# Patient Record
Sex: Female | Born: 1960
Health system: Southern US, Community
[De-identification: ages and names within clinical notes are randomized; demographics above are authoritative.]

## PROBLEM LIST (undated history)

## (undated) DIAGNOSIS — K219 Gastro-esophageal reflux disease without esophagitis: Secondary | ICD-10-CM

## (undated) DIAGNOSIS — M1990 Unspecified osteoarthritis, unspecified site: Secondary | ICD-10-CM

## (undated) DIAGNOSIS — E669 Obesity, unspecified: Secondary | ICD-10-CM

## (undated) DIAGNOSIS — E119 Type 2 diabetes mellitus without complications: Secondary | ICD-10-CM

## (undated) DIAGNOSIS — G4730 Sleep apnea, unspecified: Secondary | ICD-10-CM

## (undated) DIAGNOSIS — I10 Essential (primary) hypertension: Secondary | ICD-10-CM

## (undated) DIAGNOSIS — E785 Hyperlipidemia, unspecified: Secondary | ICD-10-CM

## (undated) HISTORY — DX: Hyperlipidemia, unspecified: E78.5

## (undated) HISTORY — DX: Essential (primary) hypertension: I10

## (undated) HISTORY — DX: Sleep apnea, unspecified: G47.30

## (undated) HISTORY — DX: Type 2 diabetes mellitus without complications: E11.9

## (undated) HISTORY — PX: UPPER GASTROINTESTINAL ENDOSCOPY: SHX188

## (undated) HISTORY — DX: Obesity, unspecified: E66.9

## (undated) HISTORY — PX: KNEE ARTHROSCOPY: SUR90

## (undated) HISTORY — DX: Gastro-esophageal reflux disease without esophagitis: K21.9

## (undated) HISTORY — DX: Unspecified osteoarthritis, unspecified site: M19.90

---

## 1997-11-19 DIAGNOSIS — E669 Obesity, unspecified: Secondary | ICD-10-CM

## 1997-11-19 HISTORY — DX: Obesity, unspecified: E66.9

## 1998-11-19 DIAGNOSIS — M1990 Unspecified osteoarthritis, unspecified site: Secondary | ICD-10-CM

## 1998-11-19 HISTORY — DX: Unspecified osteoarthritis, unspecified site: M19.90

## 2003-11-20 DIAGNOSIS — E119 Type 2 diabetes mellitus without complications: Secondary | ICD-10-CM

## 2003-11-20 HISTORY — DX: Type 2 diabetes mellitus without complications: E11.9

## 2005-11-19 DIAGNOSIS — I10 Essential (primary) hypertension: Secondary | ICD-10-CM

## 2005-11-19 DIAGNOSIS — G4730 Sleep apnea, unspecified: Secondary | ICD-10-CM

## 2005-11-19 HISTORY — DX: Essential (primary) hypertension: I10

## 2005-11-19 HISTORY — DX: Sleep apnea, unspecified: G47.30

## 2012-01-02 ENCOUNTER — Encounter: Payer: Self-pay | Admitting: Internal Medicine

## 2012-02-26 ENCOUNTER — Ambulatory Visit: Payer: Self-pay | Admitting: Internal Medicine

## 2013-02-19 ENCOUNTER — Encounter: Payer: Self-pay | Admitting: Gastroenterology

## 2013-03-10 ENCOUNTER — Ambulatory Visit: Payer: Self-pay | Admitting: Gastroenterology

## 2013-03-18 ENCOUNTER — Encounter: Payer: Self-pay | Admitting: Gastroenterology

## 2013-03-18 ENCOUNTER — Ambulatory Visit (INDEPENDENT_AMBULATORY_CARE_PROVIDER_SITE_OTHER): Payer: Medicare Other | Admitting: Gastroenterology

## 2013-03-18 ENCOUNTER — Other Ambulatory Visit (INDEPENDENT_AMBULATORY_CARE_PROVIDER_SITE_OTHER): Payer: Medicare Other

## 2013-03-18 VITALS — BP 140/90 | HR 88 | Ht 63.5 in | Wt 254.1 lb

## 2013-03-18 DIAGNOSIS — R112 Nausea with vomiting, unspecified: Secondary | ICD-10-CM

## 2013-03-18 DIAGNOSIS — K3189 Other diseases of stomach and duodenum: Secondary | ICD-10-CM | POA: Insufficient documentation

## 2013-03-18 DIAGNOSIS — R1013 Epigastric pain: Secondary | ICD-10-CM | POA: Insufficient documentation

## 2013-03-18 DIAGNOSIS — R1310 Dysphagia, unspecified: Secondary | ICD-10-CM

## 2013-03-18 LAB — CBC WITH DIFFERENTIAL/PLATELET
Basophils Absolute: 0 10*3/uL (ref 0.0–0.1)
Eosinophils Absolute: 1.3 10*3/uL — ABNORMAL HIGH (ref 0.0–0.7)
Lymphocytes Relative: 18.2 % (ref 12.0–46.0)
MCHC: 32.2 g/dL (ref 30.0–36.0)
Monocytes Absolute: 0.7 10*3/uL (ref 0.1–1.0)
Neutro Abs: 6.5 10*3/uL (ref 1.4–7.7)
Neutrophils Relative %: 62.9 % (ref 43.0–77.0)
RDW: 16.6 % — ABNORMAL HIGH (ref 11.5–14.6)

## 2013-03-18 LAB — COMPREHENSIVE METABOLIC PANEL
ALT: 11 U/L (ref 0–35)
AST: 10 U/L (ref 0–37)
Alkaline Phosphatase: 87 U/L (ref 39–117)
Calcium: 8.9 mg/dL (ref 8.4–10.5)
Chloride: 101 mEq/L (ref 96–112)
Creatinine, Ser: 0.7 mg/dL (ref 0.4–1.2)
Potassium: 3.8 mEq/L (ref 3.5–5.1)

## 2013-03-18 MED ORDER — NA SULFATE-K SULFATE-MG SULF 17.5-3.13-1.6 GM/177ML PO SOLN: 1.0000 | Freq: Once | ORAL | Status: DC

## 2013-03-18 MED ORDER — HYOSCYAMINE SULFATE 0.125 MG SL SUBL: 0.1250 mg | SUBLINGUAL_TABLET | SUBLINGUAL | Status: DC | PRN

## 2013-03-18 NOTE — Progress Notes (Signed)
History of Present Illness: Pleasant 52 year old Afro-American female referred at the request of Dr. Kathrynn Speed evaluation of dyspepsia. She complains of excess foul-smelling eructations and severe foul-smelling gas. She frequently has episodes of diarrhea. She states bloated and complains of nausea. She has occasional dysphagia to solids. He denies melena or rectal bleeding. She takes Nexium regularly. Recently she was started on an antibiotic for UTI but has not noted any change in her GI symptoms. She takes low dose of Reglan.    Past Medical History  Diagnosis Date  . Arthritis 2000  . DM (diabetes mellitus) 2005  . HTN (hypertension) 2007  . Obesity 1999  . Sleep apnea 2007  . GERD (gastroesophageal reflux disease)   . HLD (hyperlipidemia)    Past Surgical History  Procedure Laterality Date  . Knee arthroscopy Right   . Cesarean section     family history includes Diabetes in her maternal grandmother and mother and Esophageal cancer in her paternal grandmother. Current Outpatient Prescriptions  Medication Sig Dispense Refill  . amitriptyline (ELAVIL) 10 MG tablet Take 40-50 mg by mouth at bedtime.      Marland Kitchen esomeprazole (NEXIUM) 40 MG capsule Take 40 mg by mouth daily before breakfast.      . hydrochlorothiazide (MICROZIDE) 12.5 MG capsule Take 12.5 mg by mouth daily.      . insulin detemir (LEVEMIR) 100 UNIT/ML injection Inject 30 Units into the skin at bedtime.      . insulin lispro (HUMALOG) 100 UNIT/ML injection Inject 50 Units into the skin 2 (two) times daily.      . meloxicam (MOBIC) 15 MG tablet Take 15 mg by mouth daily.      . metoCLOPramide (REGLAN) 10 MG tablet Take 10 mg by mouth 3 (three) times daily.      Marland Kitchen oxyCODONE-acetaminophen (PERCOCET) 10-325 MG per tablet Take 1 tablet by mouth 4 (four) times daily as needed for pain.      . ramipril (ALTACE) 10 MG capsule Take 10 mg by mouth daily.      . simvastatin (ZOCOR) 40 MG tablet Take 40 mg by mouth daily.      .  sitaGLIPtan-metformin (JANUMET) 50-1000 MG per tablet Take 1 tablet by mouth 2 (two) times daily with a meal.      . tiZANidine (ZANAFLEX) 4 MG tablet Take 4 mg by mouth 3 (three) times daily as needed.       No current facility-administered medications for this visit.   Allergies as of 03/18/2013  . (No Known Allergies)    reports that she has never smoked. She has never used smokeless tobacco. She reports that she does not drink alcohol or use illicit drugs.     Review of Systems: Pertinent positive and negative review of systems were noted in the above HPI section. All other review of systems were otherwise negative.  Vital signs were reviewed in today's medical record Physical Exam: General: Well developed , well nourished, no acute distress Skin: anicteric Head: Normocephalic and atraumatic Eyes:  sclerae anicteric, EOMI Ears: Normal auditory acuity Mouth: No deformity or lesions Neck: Supple, no masses or thyromegaly Lungs: Clear throughout to auscultation Heart: Regular rate and rhythm; no murmurs, rubs or bruits Abdomen: Soft, non tender and non distended. No masses, hepatosplenomegaly or hernias noted. Normal Bowel sounds.  There is no succussion splash Rectal:deferred Musculoskeletal: Symmetrical with no gross deformities  Skin: No lesions on visible extremities Pulses:  Normal pulses noted Extremities: No clubbing, cyanosis, edema or deformities noted  Neurological: Alert oriented x 4, grossly nonfocal Cervical Nodes:  No significant cervical adenopathy Inguinal Nodes: No significant inguinal adenopathy Psychological:  Alert and cooperative. Normal mood and affect

## 2013-03-18 NOTE — Patient Instructions (Addendum)
You have been scheduled for a gastric emptying scan at [facility] on 04/06/2013 at 9am. Please arrive at least 15 minutes prior to your appointment for registration. Please make certain not to have anything to eat or drink after midnight the night before your test. Hold all stomach medications (ex: Zofran, phenergan, Reglan) 48 hours prior to your test. If you need to reschedule your appointment, please contact radiology scheduling at [phone]. _____________________________________________________________________ A gastric-emptying study measures how long it takes for food to move through your stomach. There are several ways to measure stomach emptying. In the most common test, you eat food that contains a small amount of radioactive material. A scanner that detects the movement of the radioactive material is placed over your abdomen to monitor the rate at which food leaves your stomach. This test normally takes about 2 hours to complete. _____________________________________________________________________  Samantha Burgess have been scheduled for an endoscopy with propofol. Please follow written instructions given to you at your visit today. If you use inhalers (even only as needed), please bring them with you on the day of your procedure. Your physician has requested that you go to www.startemmi.com and enter the access code given to you at your visit today. This web site gives a general overview about your procedure. However, you should still follow specific instructions given to you by our office regarding your preparation for the procedure.  You have been scheduled for a colonoscopy with propofol. Please follow written instructions given to you at your visit today.  Please pick up your prep kit at the pharmacy within the next 1-3 days. If you use inhalers (even only as needed), please bring them with you on the day of your procedure. Your physician has requested that you go to www.startemmi.com and enter the access  code given to you at your visit today. This web site gives a general overview about your procedure. However, you should still follow specific instructions given to you by our office regarding your preparation for the procedure.

## 2013-03-18 NOTE — Assessment & Plan Note (Signed)
Rule out early peptic stricture  Recommendations #1 upper endoscopy with dilatation as indicated  Risks, alternatives, and complications of the procedure, including bleeding, perforation, and possible need for surgery, were explained to the patient.  Patient's questions were answered.

## 2013-03-18 NOTE — Assessment & Plan Note (Addendum)
Patient's symptoms could be explained by gastroparesis and perhaps a motility disorder with bacterial overgrowth.  Active peptic disease is unlikely in the face of PPI therapy. A structural abnormality is also less likely.  Recommendations #1 upper endoscopy and colonoscopy #2 gastric emptying scan while continuing Reglan #3 check blood work including CBC and comprehensive metabolic profile

## 2013-03-24 ENCOUNTER — Encounter: Payer: Self-pay | Admitting: Gastroenterology

## 2013-03-24 ENCOUNTER — Other Ambulatory Visit: Payer: Self-pay | Admitting: Gastroenterology

## 2013-03-24 ENCOUNTER — Ambulatory Visit (AMBULATORY_SURGERY_CENTER): Payer: Medicare Other | Admitting: Gastroenterology

## 2013-03-24 VITALS — BP 149/94 | HR 86 | Temp 98.4°F | Resp 20 | Ht 63.0 in | Wt 254.0 lb

## 2013-03-24 DIAGNOSIS — K222 Esophageal obstruction: Secondary | ICD-10-CM

## 2013-03-24 DIAGNOSIS — R112 Nausea with vomiting, unspecified: Secondary | ICD-10-CM

## 2013-03-24 DIAGNOSIS — R1310 Dysphagia, unspecified: Secondary | ICD-10-CM

## 2013-03-24 DIAGNOSIS — K3189 Other diseases of stomach and duodenum: Secondary | ICD-10-CM

## 2013-03-24 MED ORDER — SODIUM CHLORIDE 0.9 % IV SOLN: 500.0000 mL | INTRAVENOUS | Status: DC

## 2013-03-24 NOTE — Progress Notes (Signed)
Patient did not experience any of the following events: a burn prior to discharge; a fall within the facility; wrong site/side/patient/procedure/implant event; or a hospital transfer or hospital admission upon discharge from the facility. (G8907) Patient did not have preoperative order for IV antibiotic SSI prophylaxis. (G8918)  

## 2013-03-24 NOTE — Op Note (Signed)
[facility] [address] [place], [address]   ENDOSCOPY PROCEDURE REPORT  PATIENT: Samantha Burgess, Samantha Burgess  MR#: [id number] BIRTHDATE: January 30, 1961 , 52  yrs. old GENDER: Female ENDOSCOPIST: Louis Meckel, MD REFERRED BY: PROCEDURE DATE:  03/24/2013 PROCEDURE:  EGD, diagnostic and Maloney dilation of esophagus ASA CLASS:     Class II INDICATIONS:  Dysphagia.   Dyspepsia. MEDICATIONS: MAC sedation, administered by CRNA, , Simethicone 0.6cc PO, and propofol (Diprivan) 250mg  IV TOPICAL ANESTHETIC: Cetacaine Spray  DESCRIPTION OF PROCEDURE: After the risks benefits and alternatives of the procedure were thoroughly explained, informed consent was obtained.  The LB [id number] endoscope was introduced through the mouth and advanced to the third portion of the duodenum. Without limitations.  The instrument was slowly withdrawn as the mucosa was fully examined.        ESOPHAGUS: A stricture was found at the gastroesophageal junction. The 7 mm gastroscope easily traversed the stricture.  The exam was otherwise normal.  Retroflexed views revealed no abnormalities. The scope was then withdrawn from the patient.  A #52 Jerene Dilling dilator was passed with moderate resistance. There was no heme.  COMPLICATIONS: There were no complications.  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

## 2013-03-24 NOTE — Progress Notes (Signed)
Informed [facility] CRNA of elevated BP. Cleared for procedure by Ms. Camp.

## 2013-03-24 NOTE — Patient Instructions (Addendum)
YOU HAD AN ENDOSCOPIC PROCEDURE TODAY AT THE [facility]: Refer to the procedure report that was given to you for any specific questions about what was found during the examination.  If the procedure report does not answer your questions, please call your gastroenterologist to clarify.  If you requested that your care partner not be given the details of your procedure findings, then the procedure report has been included in a sealed envelope for you to review at your convenience later.  YOU SHOULD EXPECT: Some feelings of bloating in the abdomen. Passage of more gas than usual.  Walking can help get rid of the air that was put into your GI tract during the procedure and reduce the bloating. If you had a lower endoscopy (such as a colonoscopy or flexible sigmoidoscopy) you may notice spotting of blood in your stool or on the toilet paper. If you underwent a bowel prep for your procedure, then you may not have a normal bowel movement for a few days.  DIET: See dilation diet- Drink plenty of fluids but you should avoid alcoholic beverages for 24 hours.  ACTIVITY: Your care partner should take you home directly after the procedure.  You should plan to take it easy, moving slowly for the rest of the day.  You can resume normal activity the day after the procedure however you should NOT DRIVE or use heavy machinery for 24 hours (because of the sedation medicines used during the test).    SYMPTOMS TO REPORT IMMEDIATELY: A gastroenterologist can be reached at any hour.  During normal business hours, 8:30 AM to 5:00 PM Monday through Friday, call [phone].  After hours and on weekends, please call the GI answering service at [phone] who will take a message and have the physician on call contact you.   Following upper endoscopy (EGD)  Vomiting of blood or coffee ground material  New chest pain or pain under the shoulder blades  Painful or persistently difficult swallowing  New  shortness of breath  Fever of 100F or higher  Black, tarry-looking stools  FOLLOW UP: If any biopsies were taken you will be contacted by phone or by letter within the next 1-3 weeks.  Call your gastroenterologist if you have not heard about the biopsies in 3 weeks.  Our staff will call the home number listed on your records the next business day following your procedure to check on you and address any questions or concerns that you may have at that time regarding the information given to you following your procedure. This is a courtesy call and so if there is no answer at the home number and we have not heard from you through the emergency physician on call, we will assume that you have returned to your regular daily activities without incident.  SIGNATURES/CONFIDENTIALITY: You and/or your care partner have signed paperwork which will be entered into your electronic medical record.  These signatures attest to the fact that that the information above on your After Visit Summary has been reviewed and is understood.  Full responsibility of the confidentiality of this discharge information lies with you and/or your care-partner.  Dilation Diet, stricture-handouts given  Repeat dilation as needed

## 2013-03-25 ENCOUNTER — Telehealth: Payer: Self-pay | Admitting: *Deleted

## 2013-03-25 ENCOUNTER — Other Ambulatory Visit: Payer: Self-pay | Admitting: Gastroenterology

## 2013-03-25 DIAGNOSIS — D509 Iron deficiency anemia, unspecified: Secondary | ICD-10-CM

## 2013-03-25 NOTE — Telephone Encounter (Signed)
Name identifier, left message, follow-up 

## 2013-03-26 ENCOUNTER — Other Ambulatory Visit (INDEPENDENT_AMBULATORY_CARE_PROVIDER_SITE_OTHER): Payer: Medicare Other

## 2013-03-26 ENCOUNTER — Other Ambulatory Visit: Payer: Self-pay | Admitting: *Deleted

## 2013-03-26 DIAGNOSIS — D649 Anemia, unspecified: Secondary | ICD-10-CM

## 2013-03-26 DIAGNOSIS — D509 Iron deficiency anemia, unspecified: Secondary | ICD-10-CM

## 2013-03-26 LAB — IBC PANEL
Saturation Ratios: 3.1 % — ABNORMAL LOW (ref 20.0–50.0)
Transferrin: 325.7 mg/dL (ref 212.0–360.0)

## 2013-03-26 LAB — FERRITIN: Ferritin: 4.3 ng/mL — ABNORMAL LOW (ref 10.0–291.0)

## 2013-03-27 LAB — RETICULIN ANTIBODIES, IGA W TITER

## 2013-03-30 LAB — GLIADIN ANTIBODIES, SERUM
Gliadin IgA: 2.5 U/mL (ref <20)
Gliadin IgG: 3.8 U/mL (ref <20)

## 2013-04-06 ENCOUNTER — Other Ambulatory Visit: Payer: Medicare Other

## 2013-04-06 ENCOUNTER — Encounter (HOSPITAL_COMMUNITY)
Admission: RE | Admit: 2013-04-06 | Discharge: 2013-04-06 | Disposition: A | Discharge status: Home / Self Care | Payer: Medicare Other | Source: Ambulatory Visit | Attending: Gastroenterology | Admitting: Gastroenterology

## 2013-04-06 DIAGNOSIS — R142 Eructation: Secondary | ICD-10-CM | POA: Insufficient documentation

## 2013-04-06 DIAGNOSIS — D509 Iron deficiency anemia, unspecified: Secondary | ICD-10-CM

## 2013-04-06 DIAGNOSIS — R112 Nausea with vomiting, unspecified: Secondary | ICD-10-CM

## 2013-04-06 DIAGNOSIS — R1013 Epigastric pain: Secondary | ICD-10-CM | POA: Insufficient documentation

## 2013-04-06 DIAGNOSIS — K219 Gastro-esophageal reflux disease without esophagitis: Secondary | ICD-10-CM | POA: Insufficient documentation

## 2013-04-06 DIAGNOSIS — R141 Gas pain: Secondary | ICD-10-CM | POA: Insufficient documentation

## 2013-04-06 DIAGNOSIS — K3189 Other diseases of stomach and duodenum: Secondary | ICD-10-CM | POA: Insufficient documentation

## 2013-04-06 MED ORDER — TECHNETIUM TC 99M SULFUR COLLOID
2.2000 | Freq: Once | INTRAVENOUS | Status: AC | PRN
  Administered 2013-04-06: 2.2 via INTRAVENOUS

## 2013-04-08 LAB — OVA AND PARASITE SCREEN: OP: NONE SEEN

## 2013-04-28 ENCOUNTER — Ambulatory Visit (AMBULATORY_SURGERY_CENTER): Payer: Medicare Other | Admitting: Gastroenterology

## 2013-04-28 ENCOUNTER — Encounter: Payer: Self-pay | Admitting: Gastroenterology

## 2013-04-28 VITALS — BP 124/83 | HR 93 | Temp 98.4°F | Resp 17 | Ht 63.0 in | Wt 254.0 lb

## 2013-04-28 DIAGNOSIS — D126 Benign neoplasm of colon, unspecified: Secondary | ICD-10-CM

## 2013-04-28 DIAGNOSIS — R197 Diarrhea, unspecified: Secondary | ICD-10-CM

## 2013-04-28 DIAGNOSIS — R112 Nausea with vomiting, unspecified: Secondary | ICD-10-CM

## 2013-04-28 MED ORDER — AMPICILLIN 250 MG PO CAPS: 250.0000 mg | ORAL_CAPSULE | Freq: Four times a day (QID) | ORAL | Status: DC

## 2013-04-28 MED ORDER — SODIUM CHLORIDE 0.9 % IV SOLN: 500.0000 mL | INTRAVENOUS | Status: DC

## 2013-04-28 NOTE — Progress Notes (Signed)
Patient did not experience any of the following events: a burn prior to discharge; a fall within the facility; wrong site/side/patient/procedure/implant event; or a hospital transfer or hospital admission upon discharge from the facility. (G8907) Patient did not have preoperative order for IV antibiotic SSI prophylaxis. (G8918)  

## 2013-04-28 NOTE — Op Note (Signed)
[facility] [address] [place], [address]   COLONOSCOPY PROCEDURE REPORT  PATIENT: Samantha Burgess, Samantha Burgess  MR#: [id number] BIRTHDATE: [birth year] , 52  yrs. old GENDER: Female ENDOSCOPIST: Louis Meckel, MD REFERRED XB:JYNW M.  Kathrynn Speed, M.D. PROCEDURE DATE:  04/28/2013 PROCEDURE:   Colonoscopy with snare polypectomy ASA CLASS:   Class II INDICATIONS: MEDICATIONS: MAC sedation, administered by CRNA and propofol (Diprivan) 500mg  IV  DESCRIPTION OF PROCEDURE:   After the risks benefits and alternatives of the procedure were thoroughly explained, informed consent was obtained.  A digital rectal exam revealed no abnormalities of the rectum.   The LB [id number]  endoscope was introduced through the anus and advanced to the cecum, which was identified by both the appendix and ileocecal valve. No adverse events experienced.   The quality of the prep was Suprep good  The instrument was then slowly withdrawn as the colon was fully examined.      COLON FINDINGS: A sessile polyp measuring 3-4 mm in size was found in the transverse colon.  A polypectomy was performed with a cold snare.  The resection was complete and the polyp tissue was completely retrieved.   A sessile polyp measuring 2 mm in size was found in the transverse colon.  A polypectomy was performed with a cold snare.  The resection was complete and the polyp tissue was completely retrieved.   The colon mucosa was otherwise normal. Retroflexed views revealed no abnormalities. The time to cecum=5 minutes 06 seconds.  Withdrawal time=16 minutes 39 seconds.  The scope was withdrawn and the procedure completed. COMPLICATIONS: There were no complications.  ENDOSCOPIC IMPRESSION: 1.   Sessile polyp measuring 3-4 mm in size was found in the transverse colon; polypectomy was performed with a cold snare 2.   Sessile polyp measuring 2 mm in size was found in the transverse colon; polypectomy was performed  with a cold snare 3.   The colon mucosa was otherwise normal  RECOMMENDATIONS: 1.  If the polyp(s) removed today are proven to be adenomatous (pre-cancerous) polyps, you will need a repeat colonoscopy in 5 years.  Otherwise you should continue to follow colorectal cancer screening guidelines for "routine risk" patients with colonoscopy in 10 years.  You will receive a letter within 1-2 weeks with the results of your biopsy as well as final recommendations.  Please call my office if you have not received a letter after 3 weeks. 2.   trial of ampicillin 250 mg 4 times a day for possible bacterial overgrowth (abnormal gastric emptying scan)   eSigned:  Louis Meckel, MD 04/28/2013 2:22 PM   cc:

## 2013-04-28 NOTE — Progress Notes (Signed)
Called to room to assist during endoscopic procedure.  Patient ID and intended procedure confirmed with present staff. Received instructions for my participation in the procedure from the performing physician.  

## 2013-04-28 NOTE — Patient Instructions (Addendum)

## 2013-04-29 ENCOUNTER — Telehealth: Payer: Self-pay | Admitting: *Deleted

## 2013-04-29 NOTE — Telephone Encounter (Signed)
  Follow up Call-  Call back number 04/28/2013 03/24/2013  Post procedure Call Back phone  # [phone]  Permission to leave phone message Yes Yes     Patient questions:  Do you have a fever, pain , or abdominal swelling? no Pain Score  0 *  Have you tolerated food without any problems? yes  Have you been able to return to your normal activities? yes  Do you have any questions about your discharge instructions: Diet   no Medications  no Follow up visit  no  Do you have questions or concerns about your Care? no  Actions: * If pain score is 4 or above: No action needed, pain <4.

## 2013-05-06 ENCOUNTER — Encounter: Payer: Self-pay | Admitting: Gastroenterology

## 2013-06-16 ENCOUNTER — Other Ambulatory Visit: Payer: Self-pay | Admitting: Gastroenterology

## 2013-06-17 ENCOUNTER — Telehealth: Payer: Self-pay | Admitting: Gastroenterology

## 2013-06-17 ENCOUNTER — Ambulatory Visit: Payer: Medicare Other | Admitting: Gastroenterology

## 2013-06-17 MED ORDER — HYOSCYAMINE SULFATE 0.125 MG SL SUBL: SUBLINGUAL_TABLET | SUBLINGUAL | Status: DC

## 2013-06-17 NOTE — Telephone Encounter (Signed)
Med sent no charge

## 2013-07-27 ENCOUNTER — Ambulatory Visit: Payer: Medicare Other | Admitting: Gastroenterology

## 2014-09-07 ENCOUNTER — Other Ambulatory Visit: Payer: Self-pay | Admitting: Gastroenterology

## 2016-01-16 ENCOUNTER — Encounter (HOSPITAL_BASED_OUTPATIENT_CLINIC_OR_DEPARTMENT_OTHER): Payer: Self-pay | Admitting: *Deleted

## 2016-01-16 ENCOUNTER — Emergency Department (HOSPITAL_BASED_OUTPATIENT_CLINIC_OR_DEPARTMENT_OTHER): Payer: No Typology Code available for payment source

## 2016-01-16 ENCOUNTER — Emergency Department (HOSPITAL_BASED_OUTPATIENT_CLINIC_OR_DEPARTMENT_OTHER)
Admission: EM | Admit: 2016-01-16 | Discharge: 2016-01-16 | Disposition: A | Discharge status: Home / Self Care | Payer: No Typology Code available for payment source | Attending: Emergency Medicine | Admitting: Emergency Medicine

## 2016-01-16 DIAGNOSIS — S29019A Strain of muscle and tendon of unspecified wall of thorax, initial encounter: Secondary | ICD-10-CM

## 2016-01-16 DIAGNOSIS — S29012A Strain of muscle and tendon of back wall of thorax, initial encounter: Secondary | ICD-10-CM | POA: Diagnosis not present

## 2016-01-16 DIAGNOSIS — Z8669 Personal history of other diseases of the nervous system and sense organs: Secondary | ICD-10-CM | POA: Insufficient documentation

## 2016-01-16 DIAGNOSIS — Z8719 Personal history of other diseases of the digestive system: Secondary | ICD-10-CM | POA: Insufficient documentation

## 2016-01-16 DIAGNOSIS — S299XXA Unspecified injury of thorax, initial encounter: Secondary | ICD-10-CM | POA: Diagnosis present

## 2016-01-16 DIAGNOSIS — E785 Hyperlipidemia, unspecified: Secondary | ICD-10-CM | POA: Insufficient documentation

## 2016-01-16 DIAGNOSIS — Z794 Long term (current) use of insulin: Secondary | ICD-10-CM | POA: Diagnosis not present

## 2016-01-16 DIAGNOSIS — E119 Type 2 diabetes mellitus without complications: Secondary | ICD-10-CM | POA: Diagnosis not present

## 2016-01-16 DIAGNOSIS — Z7984 Long term (current) use of oral hypoglycemic drugs: Secondary | ICD-10-CM | POA: Diagnosis not present

## 2016-01-16 DIAGNOSIS — E669 Obesity, unspecified: Secondary | ICD-10-CM | POA: Insufficient documentation

## 2016-01-16 DIAGNOSIS — M1990 Unspecified osteoarthritis, unspecified site: Secondary | ICD-10-CM | POA: Insufficient documentation

## 2016-01-16 DIAGNOSIS — Y92410 Unspecified street and highway as the place of occurrence of the external cause: Secondary | ICD-10-CM | POA: Insufficient documentation

## 2016-01-16 DIAGNOSIS — Y9389 Activity, other specified: Secondary | ICD-10-CM | POA: Diagnosis not present

## 2016-01-16 DIAGNOSIS — Y998 Other external cause status: Secondary | ICD-10-CM | POA: Insufficient documentation

## 2016-01-16 DIAGNOSIS — Z79899 Other long term (current) drug therapy: Secondary | ICD-10-CM | POA: Insufficient documentation

## 2016-01-16 NOTE — ED Notes (Signed)
Pt given d/c instructions. Verbalizes understanding. No questions. 
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
## 2016-05-16 DIAGNOSIS — E1122 Type 2 diabetes mellitus with diabetic chronic kidney disease: Secondary | ICD-10-CM

## 2017-02-04 ENCOUNTER — Encounter (HOSPITAL_BASED_OUTPATIENT_CLINIC_OR_DEPARTMENT_OTHER): Payer: Self-pay | Admitting: Emergency Medicine

## 2017-02-04 ENCOUNTER — Emergency Department (HOSPITAL_BASED_OUTPATIENT_CLINIC_OR_DEPARTMENT_OTHER)
Admission: EM | Admit: 2017-02-04 | Discharge: 2017-02-04 | Disposition: A | Discharge status: Home / Self Care | Payer: Medicare HMO | Attending: Emergency Medicine | Admitting: Emergency Medicine

## 2017-02-04 DIAGNOSIS — I10 Essential (primary) hypertension: Secondary | ICD-10-CM | POA: Diagnosis not present

## 2017-02-04 DIAGNOSIS — H66004 Acute suppurative otitis media without spontaneous rupture of ear drum, recurrent, right ear: Secondary | ICD-10-CM | POA: Insufficient documentation

## 2017-02-04 DIAGNOSIS — E119 Type 2 diabetes mellitus without complications: Secondary | ICD-10-CM | POA: Diagnosis not present

## 2017-02-04 DIAGNOSIS — H66001 Acute suppurative otitis media without spontaneous rupture of ear drum, right ear: Secondary | ICD-10-CM

## 2017-02-04 DIAGNOSIS — Z79899 Other long term (current) drug therapy: Secondary | ICD-10-CM | POA: Insufficient documentation

## 2017-02-04 DIAGNOSIS — H9201 Otalgia, right ear: Secondary | ICD-10-CM | POA: Diagnosis present

## 2017-02-04 DIAGNOSIS — H60311 Diffuse otitis externa, right ear: Secondary | ICD-10-CM

## 2017-02-04 DIAGNOSIS — Z794 Long term (current) use of insulin: Secondary | ICD-10-CM | POA: Insufficient documentation

## 2017-02-04 DIAGNOSIS — H60501 Unspecified acute noninfective otitis externa, right ear: Secondary | ICD-10-CM | POA: Insufficient documentation

## 2017-02-04 MED ORDER — AMOXICILLIN-POT CLAVULANATE 875-125 MG PO TABS: 1.0000 | ORAL_TABLET | Freq: Two times a day (BID) | ORAL | 0 refills | Status: DC

## 2017-02-04 MED ORDER — IBUPROFEN 800 MG PO TABS
800.0000 mg | ORAL_TABLET | Freq: Once | ORAL | Status: AC
  Administered 2017-02-04: 800 mg via ORAL
  Filled 2017-02-04: qty 1

## 2017-02-04 MED ORDER — AMOXICILLIN-POT CLAVULANATE 875-125 MG PO TABS
1.0000 | ORAL_TABLET | Freq: Once | ORAL | Status: AC
  Administered 2017-02-04: 1 via ORAL
  Filled 2017-02-04: qty 1

## 2017-02-04 MED ORDER — HYDROCODONE-ACETAMINOPHEN 5-325 MG PO TABS
2.0000 | ORAL_TABLET | Freq: Once | ORAL | Status: AC
  Administered 2017-02-04: 2 via ORAL
  Filled 2017-02-04: qty 2

## 2017-02-04 MED ORDER — HYDROCODONE-ACETAMINOPHEN 5-325 MG PO TABS: 1.0000 | ORAL_TABLET | ORAL | 0 refills | Status: DC | PRN

## 2017-02-04 MED ORDER — CIPROFLOXACIN-HYDROCORTISONE 0.2-1 % OT SUSP: 3.0000 [drp] | Freq: Two times a day (BID) | OTIC | 0 refills | Status: AC

## 2017-02-04 MED FILL — AMOX-CLAV 875-125 MG TABLET: 875-125 | 7 days supply | Qty: 14 | Fill #0

## 2017-02-04 NOTE — ED Provider Notes (Signed)
[facility] Provider Note   CSN: [id number] Arrival date & time: 02/04/17  0813     History   Chief Complaint Chief Complaint  Patient presents with  . Otalgia    HPI Samantha Burgess is a 56 y.o. female.  HPI Patient started having ear pain that was mild about 3 days ago. Yesterday however started to become severe. By last night the patient was in severe pain and unable to sleep. She reports that sharp and aching in her ear and on the right side of her face. She has had no associated symptoms. She denies fever, chills, sinus congestion, nasal discharge or sore throat. Reports she has not been having problems her teeth. Does have a couple of teeth that are broken off and decayed in the back but she reports that they have not been causing her any pain and they've been that way a long time. She is diabetic. She had been trying an over-the-counter eardrop started yesterday without any relief. Patient is diabetic. Past Medical History:  Diagnosis Date  . Arthritis 2000  . DM (diabetes mellitus) ([place]) 2005  . GERD (gastroesophageal reflux disease)   . HLD (hyperlipidemia)   . HTN (hypertension) 2007  . Obesity 1999  . Sleep apnea 2007    Patient Active Problem List   Diagnosis Date Noted  . Dyspepsia and other specified disorders of function of stomach 03/18/2013  . Dysphagia, unspecified(787.20) 03/18/2013    Past Surgical History:  Procedure Laterality Date  . CESAREAN SECTION    . KNEE ARTHROSCOPY Right   . UPPER GASTROINTESTINAL ENDOSCOPY      OB History    No data available       Home Medications    Prior to Admission medications   Medication Sig Start Date End Date Taking? Authorizing Provider  hydrochlorothiazide (HYDRODIURIL) 25 MG tablet Take 25 mg by mouth daily.   Yes Historical Provider, MD  insulin aspart protamine- aspart (NOVOLOG MIX 70/30) (70-30) 100 UNIT/ML injection Inject into the skin.   Yes Historical Provider, MD  ramipril  (ALTACE) 10 MG capsule Take 10 mg by mouth daily.   Yes Historical Provider, MD  amitriptyline (ELAVIL) 10 MG tablet Take 40-50 mg by mouth at bedtime.    Historical Provider, MD  amoxicillin-clavulanate (AUGMENTIN) 875-125 MG tablet Take 1 tablet by mouth 2 (two) times daily. One po bid x 7 days 02/04/17   Charlesetta Shanks, MD  ciprofloxacin-hydrocortisone (CIPRO [facility]) otic suspension Place 3 drops into the right ear 2 (two) times daily. 02/04/17 02/11/17  Charlesetta Shanks, MD  HYDROcodone-acetaminophen (NORCO/VICODIN) 5-325 MG tablet Take 1-2 tablets by mouth every 4 (four) hours as needed for moderate pain or severe pain. 02/04/17   Charlesetta Shanks, MD  insulin detemir (LEVEMIR) 100 UNIT/ML injection Inject 30 Units into the skin at bedtime.    Historical Provider, MD  insulin lispro (HUMALOG) 100 UNIT/ML injection Inject 50 Units into the skin 2 (two) times daily.    Historical Provider, MD  oxyCODONE-acetaminophen (PERCOCET) 10-325 MG per tablet Take 1 tablet by mouth 4 (four) times daily as needed for pain.    Historical Provider, MD  simvastatin (ZOCOR) 40 MG tablet Take 40 mg by mouth daily.    Historical Provider, MD  sitaGLIPtan-metformin (JANUMET) 50-1000 MG per tablet Take 1 tablet by mouth 2 (two) times daily with a meal.    Historical Provider, MD    Family History Family History  Problem Relation Age of Onset  . Esophageal cancer Paternal  Grandmother   . Diabetes Mother   . Diabetes Maternal Grandmother   . Colon cancer Neg Hx   . Rectal cancer Neg Hx   . Stomach cancer Neg Hx     Social History Social History  Substance Use Topics  . Smoking status: Never Smoker  . Smokeless tobacco: Never Used  . Alcohol use No     Allergies   Patient has no known allergies.   Review of Systems Review of Systems 10 Systems reviewed and are negative for acute change except as noted in the HPI.   Physical Exam Updated Vital Signs BP (!) 179/108 (BP Location: Left Arm)   Pulse 100    Temp 98.1 F (36.7 C) (Oral)   Resp 18   Ht 5\' 3"  (1.6 m)   Wt 270 lb (122.5 kg)   LMP 03/30/2013   SpO2 100%   BMI 47.83 kg/m   Physical Exam  Constitutional: She is oriented to person, place, and time.  Patient is alert and nontoxic. He is tearful and appears to be in significant pain. Morbid obesity no respiratory distress.  HENT:  Head: Normocephalic and atraumatic.  Right external pinna normal in appearance. No lesions erythema or swelling of the pinna. Patient however does endorse pain and tenderness with movement of the tragus. Ear canal is slightly boggy without significant erythema or discharge. The TM is easily visible. TM is irregular with complete loss of normal landmarks.  Oral examination shows a posterior airway widely patent. No erythema or exudate on the mucous membranes. Patient does have posterior upper molars that are decayed to the gumline and broken off. Is no erythema or exudate or bogginess around these. Range of motion of the jaw without trismus. No facial swelling.  Eyes: EOM are normal.  Neck: Neck supple.  Cardiovascular: Normal rate and regular rhythm.   Pulmonary/Chest: Effort normal and breath sounds normal.  Musculoskeletal: Normal range of motion.  Neurological: She is alert and oriented to person, place, and time. No cranial nerve deficit. Coordination normal.  Skin: Skin is warm and dry.  Psychiatric: She has a normal mood and affect.     ED Treatments / Results  Labs (all labs ordered are listed, but only abnormal results are displayed) Labs Reviewed - No data to display  EKG  EKG Interpretation None       Radiology No results found.  Procedures Procedures (including critical care time)  Medications Ordered in ED Medications  ibuprofen (ADVIL,MOTRIN) tablet 800 mg (not administered)  HYDROcodone-acetaminophen (NORCO/VICODIN) 5-325 MG per tablet 2 tablet (not administered)  amoxicillin-clavulanate (AUGMENTIN) 875-125 MG per tablet 1  tablet (not administered)     Initial Impression / Assessment and Plan / ED Course  I have reviewed the triage vital signs and the nursing notes.  Pertinent labs & imaging results that were available during my care of the patient were reviewed by me and considered in my medical decision making (see chart for details).      Final Clinical Impressions(s) / ED Diagnoses   Final diagnoses:  Acute suppurative otitis media of right ear without spontaneous rupture of tympanic membrane, recurrence not specified  Acute diffuse otitis externa of right ear  Patient has a combination of findings. Patient is diabetic and ear canal is slightly boggy although not significantly swollen. With combination of tragus tenderness, diabetic and bogginess I will opt to initiate her for otitis externa with ciprofloxacin otic solution. TM also is consistent with otitis media will also initiate Augmentin.  The patient does have significant pain, she will be treated with Vicodin for pain control.  New Prescriptions New Prescriptions   AMOXICILLIN-CLAVULANATE (AUGMENTIN) 875-125 MG TABLET    Take 1 tablet by mouth 2 (two) times daily. One po bid x 7 days   CIPROFLOXACIN-HYDROCORTISONE (CIPRO [facility]) OTIC SUSPENSION    Place 3 drops into the right ear 2 (two) times daily.   HYDROCODONE-ACETAMINOPHEN (NORCO/VICODIN) 5-325 MG TABLET    Take 1-2 tablets by mouth every 4 (four) hours as needed for moderate pain or severe pain.     Charlesetta Shanks, MD 02/04/17 [phone]

## 2017-02-04 NOTE — ED Triage Notes (Signed)
Patient reports that she is having pain to her right ear. Has had the pain x 3 days

## 2017-02-06 ENCOUNTER — Telehealth (HOSPITAL_BASED_OUTPATIENT_CLINIC_OR_DEPARTMENT_OTHER): Payer: Self-pay | Admitting: Emergency Medicine

## 2017-05-09 ENCOUNTER — Encounter (HOSPITAL_BASED_OUTPATIENT_CLINIC_OR_DEPARTMENT_OTHER): Payer: Self-pay | Admitting: *Deleted

## 2017-05-09 ENCOUNTER — Emergency Department (HOSPITAL_BASED_OUTPATIENT_CLINIC_OR_DEPARTMENT_OTHER)
Admission: EM | Admit: 2017-05-09 | Discharge: 2017-05-10 | Disposition: A | Discharge status: Home / Self Care | Payer: Medicare HMO | Attending: Emergency Medicine | Admitting: Emergency Medicine

## 2017-05-09 DIAGNOSIS — E119 Type 2 diabetes mellitus without complications: Secondary | ICD-10-CM | POA: Diagnosis not present

## 2017-05-09 DIAGNOSIS — Z95828 Presence of other vascular implants and grafts: Secondary | ICD-10-CM

## 2017-05-09 DIAGNOSIS — I10 Essential (primary) hypertension: Secondary | ICD-10-CM | POA: Insufficient documentation

## 2017-05-09 DIAGNOSIS — Z794 Long term (current) use of insulin: Secondary | ICD-10-CM | POA: Diagnosis not present

## 2017-05-09 DIAGNOSIS — Y828 Other medical devices associated with adverse incidents: Secondary | ICD-10-CM | POA: Diagnosis not present

## 2017-05-09 DIAGNOSIS — T82524A Displacement of infusion catheter, initial encounter: Secondary | ICD-10-CM | POA: Insufficient documentation

## 2017-05-09 DIAGNOSIS — Z7984 Long term (current) use of oral hypoglycemic drugs: Secondary | ICD-10-CM | POA: Insufficient documentation

## 2017-05-09 NOTE — ED Triage Notes (Addendum)
Pt c/o picc line falling out . Receiving IV ABX for infection

## 2017-05-10 ENCOUNTER — Other Ambulatory Visit (HOSPITAL_BASED_OUTPATIENT_CLINIC_OR_DEPARTMENT_OTHER): Payer: Self-pay | Admitting: Emergency Medicine

## 2017-05-10 ENCOUNTER — Ambulatory Visit (HOSPITAL_COMMUNITY)
Admission: RE | Admit: 2017-05-10 | Discharge: 2017-05-10 | Disposition: A | Discharge status: Home / Self Care | Payer: Medicare HMO | Source: Ambulatory Visit | Attending: Emergency Medicine | Admitting: Emergency Medicine

## 2017-05-10 ENCOUNTER — Encounter (HOSPITAL_COMMUNITY): Payer: Self-pay | Admitting: Interventional Radiology

## 2017-05-10 DIAGNOSIS — T82524A Displacement of infusion catheter, initial encounter: Secondary | ICD-10-CM | POA: Insufficient documentation

## 2017-05-10 DIAGNOSIS — Y712 Prosthetic and other implants, materials and accessory cardiovascular devices associated with adverse incidents: Secondary | ICD-10-CM

## 2017-05-10 DIAGNOSIS — B999 Unspecified infectious disease: Secondary | ICD-10-CM

## 2017-05-10 HISTORY — PX: IR FLUORO GUIDE CV LINE RIGHT: IMG2283

## 2017-05-10 MED ORDER — HEPARIN SOD (PORK) LOCK FLUSH 100 UNIT/ML IV SOLN
INTRAVENOUS | Status: AC
  Filled 2017-05-10: qty 5

## 2017-05-10 MED ORDER — LIDOCAINE HCL (PF) 1 % IJ SOLN
INTRAMUSCULAR | Status: AC
  Filled 2017-05-10: qty 30

## 2017-05-10 MED ORDER — CHLORHEXIDINE GLUCONATE 4 % EX LIQD
CUTANEOUS | Status: AC
  Filled 2017-05-10: qty 15

## 2017-05-10 MED ORDER — HEPARIN SOD (PORK) LOCK FLUSH 100 UNIT/ML IV SOLN
INTRAVENOUS | Status: DC | PRN
  Administered 2017-05-10: 500 [IU] via INTRAVENOUS

## 2017-05-10 MED ORDER — PIPERACILLIN-TAZOBACTAM 3.375 G IVPB 30 MIN
3.3750 g | Freq: Once | INTRAVENOUS | Status: AC
  Administered 2017-05-10: 3.375 g via INTRAVENOUS
  Filled 2017-05-10 (×2): qty 50

## 2017-05-10 NOTE — ED Provider Notes (Signed)
[facility] Provider Note   CSN: [id number] Arrival date & time: 05/09/17  2149     History   Chief Complaint Chief Complaint  Patient presents with  . Vascular Access Problem    HPI Samantha Burgess is a 56 y.o. female.  The history is provided by the patient.  She has a PICC line in place through which she is receiving continuous infusion of Zosyn. The line was partially pulled out accidentally at about 9 PM. The antibiotic is being given for an ear infection which had not responded to oral antibiotics.  Past Medical History:  Diagnosis Date  . Arthritis 2000  . DM (diabetes mellitus) ([place]) 2005  . GERD (gastroesophageal reflux disease)   . HLD (hyperlipidemia)   . HTN (hypertension) 2007  . Obesity 1999  . Sleep apnea 2007    Patient Active Problem List   Diagnosis Date Noted  . Dyspepsia and other specified disorders of function of stomach 03/18/2013  . Dysphagia, unspecified(787.20) 03/18/2013    Past Surgical History:  Procedure Laterality Date  . CESAREAN SECTION    . KNEE ARTHROSCOPY Right   . UPPER GASTROINTESTINAL ENDOSCOPY      OB History    No data available       Home Medications    Prior to Admission medications   Medication Sig Start Date End Date Taking? Authorizing Provider  amitriptyline (ELAVIL) 10 MG tablet Take 40-50 mg by mouth at bedtime.    [provider]  amoxicillin-clavulanate (AUGMENTIN) 875-125 MG tablet Take 1 tablet by mouth 2 (two) times daily. One po bid x 7 days 02/04/17   Charlesetta Shanks, MD  hydrochlorothiazide (HYDRODIURIL) 25 MG tablet Take 25 mg by mouth daily.    [provider]  HYDROcodone-acetaminophen (NORCO/VICODIN) 5-325 MG tablet Take 1-2 tablets by mouth every 4 (four) hours as needed for moderate pain or severe pain. 02/04/17   Charlesetta Shanks, MD  insulin aspart protamine- aspart (NOVOLOG MIX 70/30) (70-30) 100 UNIT/ML injection Inject into the skin.    [provider]    insulin detemir (LEVEMIR) 100 UNIT/ML injection Inject 30 Units into the skin at bedtime.    [provider]  insulin lispro (HUMALOG) 100 UNIT/ML injection Inject 50 Units into the skin 2 (two) times daily.    [provider]  oxyCODONE-acetaminophen (PERCOCET) 10-325 MG per tablet Take 1 tablet by mouth 4 (four) times daily as needed for pain.    [provider]  ramipril (ALTACE) 10 MG capsule Take 10 mg by mouth daily.    [provider]  simvastatin (ZOCOR) 40 MG tablet Take 40 mg by mouth daily.    [provider]  sitaGLIPtan-metformin (JANUMET) 50-1000 MG per tablet Take 1 tablet by mouth 2 (two) times daily with a meal.    [provider]    Family History Family History  Problem Relation Age of Onset  . Esophageal cancer Paternal Grandmother   . Diabetes Mother   . Diabetes Maternal Grandmother   . Colon cancer Neg Hx   . Rectal cancer Neg Hx   . Stomach cancer Neg Hx     Social History Social History  Substance Use Topics  . Smoking status: Never Smoker  . Smokeless tobacco: Never Used  . Alcohol use No     Allergies   Patient has no known allergies.   Review of Systems Review of Systems  All other systems reviewed and are negative.    Physical Exam Updated  Vital Signs BP 117/80 (BP Location: Left Arm)   Pulse 96   Temp 98.5 F (36.9 C) (Oral)   Resp 20   Ht 5\' 3"  (1.6 m)   Wt 122.5 kg (270 lb)   LMP 03/30/2013   SpO2 98%   BMI 47.83 kg/m   Physical Exam  Nursing note and vitals reviewed.  56 year old female, resting comfortably and in no acute distress. Vital signs are normal. Oxygen saturation is 98%, which is normal. Head is normocephalic and atraumatic. PERRLA, EOMI. Oropharynx is clear. Neck is nontender and supple without adenopathy or JVD. Back is nontender and there is no CVA tenderness. Lungs are clear without rales, wheezes, or rhonchi. Chest is nontender. Heart has regular rate  and rhythm without murmur. Abdomen is soft, flat, nontender without masses or hepatosplenomegaly and peristalsis is normoactive. Extremities have no cyanosis or edema, full range of motion is present. PICC line in right antecubital space is partly pulled out. Skin is warm and dry without rash. Neurologic: Mental status is normal, cranial nerves are intact, there are no motor or sensory deficits.  ED Treatments / Results   Procedures Procedures (including critical care time)  Medications Ordered in ED Medications  piperacillin-tazobactam (ZOSYN) IVPB 3.375 g (not administered)     Initial Impression / Assessment and Plan / ED Course  I have reviewed the triage vital signs and the nursing notes.  Partially pulled out PICC line. I do not have the capability of inserting a PICC line tonight. Arrangements will be made for PICC line insertion tomorrow. In the meantime, she'll be given an intravenous dose of Zosyn which should keep her appropriately covered until the line can be placed tomorrow. Unfortunately, I have not been able to access her records with [facility].  Final Clinical Impressions(s) / ED Diagnoses   Final diagnoses:  Status post peripherally inserted central catheter (PICC) central line placement    New Prescriptions New Prescriptions   No medications on file     Delora Fuel, MD 30/07/62 0116

## 2017-05-10 NOTE — Discharge Instructions (Signed)
The interventional radiology department at [facility] will contact you regarding a time for PICC line insertion.

## 2017-12-23 DIAGNOSIS — I10 Essential (primary) hypertension: Secondary | ICD-10-CM | POA: Diagnosis present

## 2018-04-09 ENCOUNTER — Encounter: Payer: Self-pay | Admitting: Gastroenterology

## 2018-08-20 IMAGING — XA IR FLUORO GUIDE CV LINE*R*
1 series · 2 of 2 positions shown · non-contrast
Comparison: none

INDICATION: 56-year-old female with partially displaced PICC line.

[Series 1: fl angio · 2 of 2 slices shown]
[im 1/2]
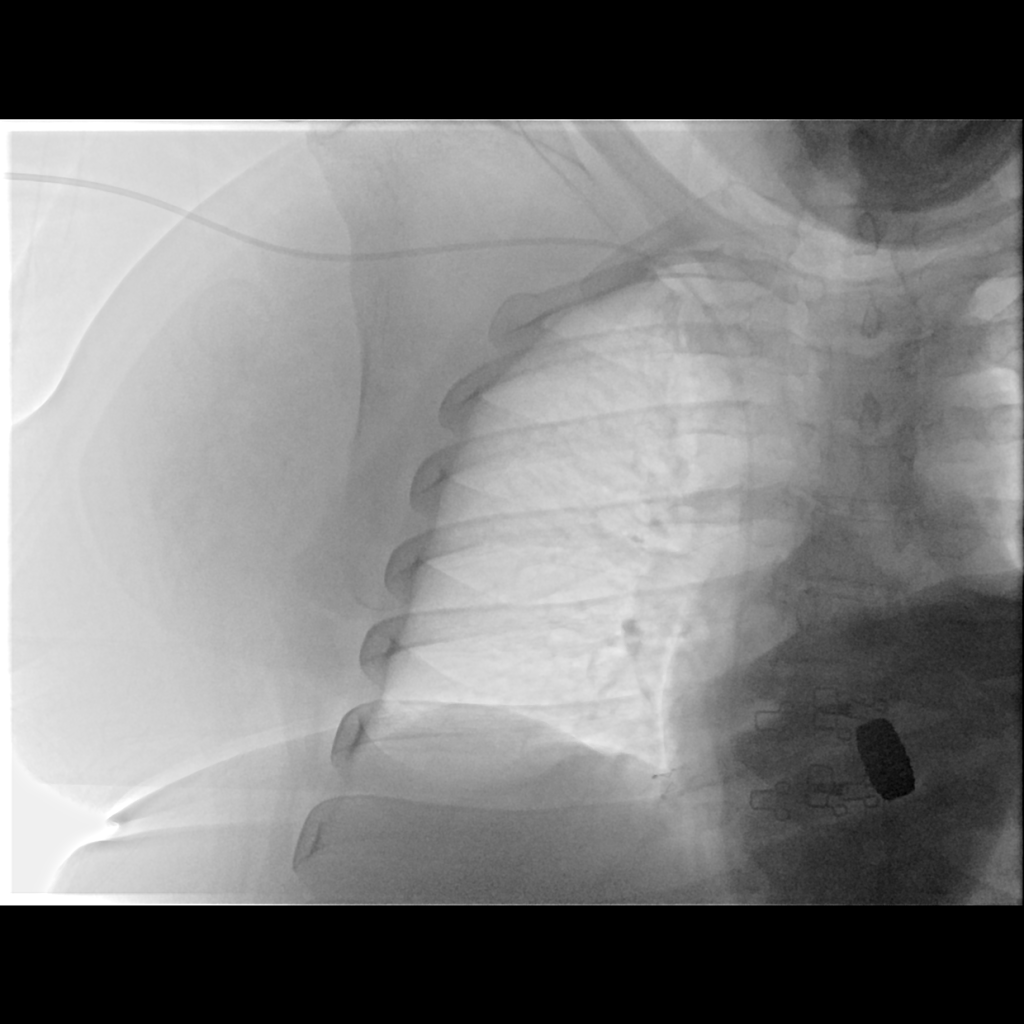
[im 2/2]
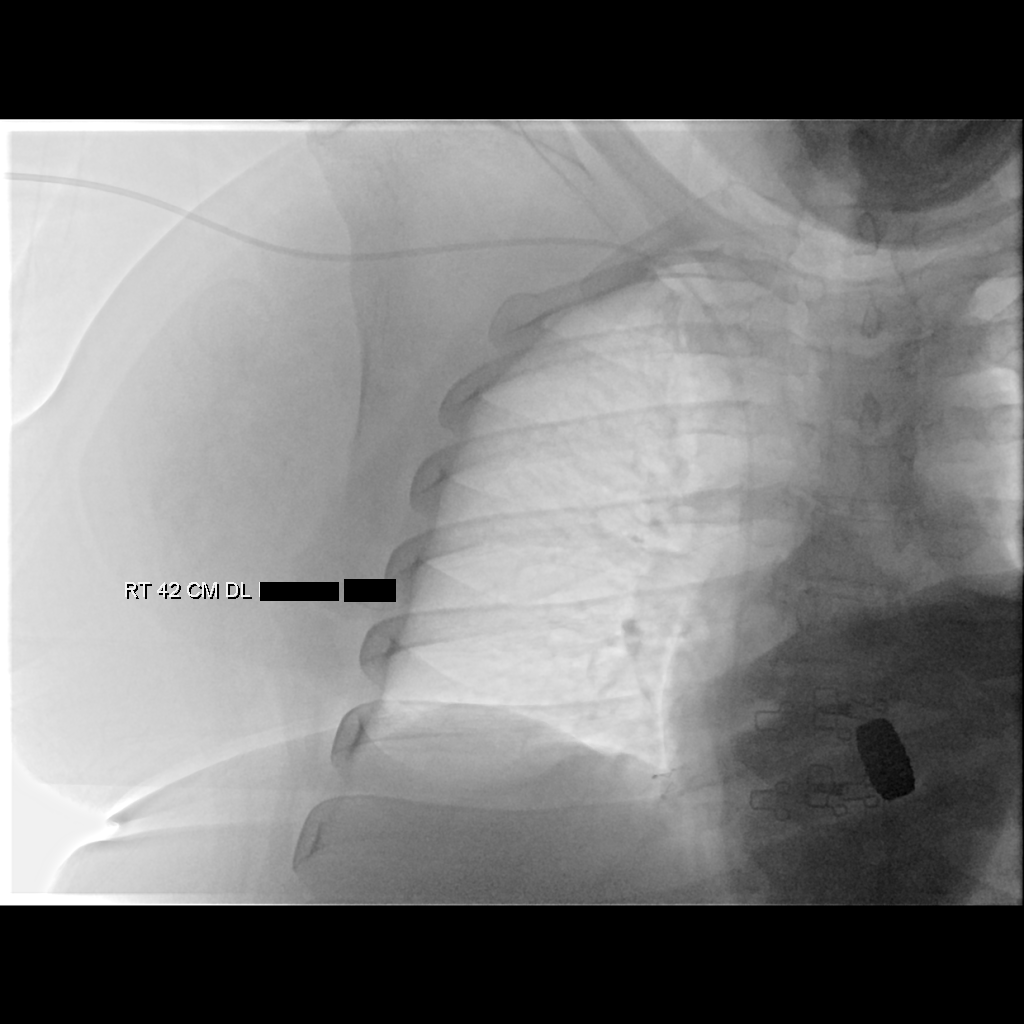

[2 of 2 positions shown; findings below may reference images not displayed]

EXAM:
PICC LINE exchanged WITH FLUOROSCOPIC GUIDANCE

MEDICATIONS:
None

ANESTHESIA/SEDATION:
None

FLUOROSCOPY TIME:  Fluoroscopy Time: 0 minutes 54 seconds (26.1
mGy).

COMPLICATIONS:
None immediate.

PROCEDURE:
The patient was advised of the possible risks and complications and
agreed to undergo the procedure. The patient was then brought to the
angiographic suite for the procedure.

The right arm was prepped with chlorhexidine, draped in the usual
sterile fashion using maximum barrier technique (cap and mask,
sterile gown, sterile gloves, large sterile sheet, hand hygiene and
cutaneous antisepsis) and infiltrated locally with 1% Lidocaine.

The existing right upper extremity PICC was cut near the skin entry
site and a 0.018 inch wire was advanced through the catheter and
into the right heart. The catheter was removed over the wire. The
peel-away sheath was then advanced over the wire and into the right
arm vein. The removed catheter was inspected and found to be intact.
The catheter length was 42 cm. A new dual-lumen power injectable
PICC was cut to 42 cm and advanced over the wire and position with
the tip in the upper right atrium. The catheter was flushed and
secured to the skin with an adhesive fixation device.

Catheter length: 42 cm
IMPRESSION: Successful right arm power injectable dual-lumen PICC line placement
with fluoroscopic guidance. The catheter is ready for use.

## 2023-04-29 ENCOUNTER — Other Ambulatory Visit: Payer: Self-pay

## 2023-04-29 ENCOUNTER — Emergency Department
Admission: EM | Admit: 2023-04-29 | Discharge: 2023-04-29 | Disposition: A | Discharge status: Home / Self Care | Payer: 59 | Attending: Emergency Medicine | Admitting: Emergency Medicine

## 2023-04-29 ENCOUNTER — Encounter: Payer: Self-pay | Admitting: Emergency Medicine

## 2023-04-29 DIAGNOSIS — L02212 Cutaneous abscess of back [any part, except buttock]: Secondary | ICD-10-CM | POA: Insufficient documentation

## 2023-04-29 MED ORDER — HYDROCODONE-ACETAMINOPHEN 5-325 MG PO TABS
1.0000 | ORAL_TABLET | Freq: Once | ORAL | Status: AC
  Administered 2023-04-29: 1 via ORAL
  Filled 2023-04-29: qty 1

## 2023-04-29 NOTE — ED Provider Notes (Signed)
[facility] Provider Note    Event Date/Time   First MD Initiated Contact with Patient 04/29/23 1136     (approximate)   History   Abscess   HPI  Samantha Burgess is a 62 y.o. female presenting to the emergency department for evaluation of back pain. Has a history of a "blackhead' that occasionally swells over her upper back.  On Friday became significantly more swollen and red.  Went to [facility], where they performed an I&D with minimal output.  Started on oral antibiotic.  Patient reports is initially improving, but today had worsening swelling.  No fevers, numbness, tingling, bowel or bladder symptoms.     Physical Exam   Triage Vital Signs: ED Triage Vitals  Enc Vitals Group     BP 04/29/23 1046 (!) 174/100     Pulse Rate 04/29/23 1046 (!) 110     Resp 04/29/23 1046 16     Temp 04/29/23 1046 (!) 97.5 F (36.4 C)     Temp Source 04/29/23 1046 Oral     SpO2 04/29/23 1046 98 %     Weight 04/29/23 1045 270 lb 1 oz (122.5 kg)     Height 04/29/23 1045 5\' 3"  (1.6 m)     Head Circumference --      Peak Flow --      Pain Score 04/29/23 1045 0     Pain Loc --      Pain Edu? --      Excl. in GC? --     Most recent vital signs: Vitals:   04/29/23 1046  BP: (!) 174/100  Pulse: (!) 110  Resp: 16  Temp: (!) 97.5 F (36.4 C)  SpO2: 98%     General: Awake, interactive  CV:  Mild tachycardia, good peripheral perfusion.  Resp:  Lungs clear, unlabored respirations.  Abd:  Soft, nondistended.  Neuro:  Symmetric facial movement, fluid speech MSK:  There is an approximately 3 x 3 cm area of swelling with partial area of associated fluctuance over the right upper back.  There is some purplish erythema over the area, well-circumscribed.  Under ultrasound, well-circumscribed area noted, no color change suggestive of vascular return noted   ED Results / Procedures / Treatments   Labs (all labs ordered are listed, but only abnormal results are  displayed) Labs Reviewed - No data to display   EKG EKG independently reviewed interpreted by myself (ER attending) demonstrates:    RADIOLOGY Imaging independently reviewed and interpreted by myself demonstrates:    PROCEDURES:  Critical Care performed: No  ..Incision and Drainage  Date/Time: 04/29/2023 3:22 PM  Performed by: Trinna Post, MD Authorized by: Trinna Post, MD   Consent:    Consent obtained:  Verbal   Consent given by:  Patient   Risks discussed:  Bleeding, incomplete drainage and infection   Alternatives discussed:  No treatment Universal protocol:    Patient identity confirmed:  Verbally with patient Location:    Type:  Abscess   Location:  Trunk   Trunk location:  Back Pre-procedure details:    Skin preparation:  Chlorhexidine Sedation:    Sedation type:  None Anesthesia:    Anesthesia method:  Local infiltration Procedure details:    Ultrasound guidance: yes     Incision types:  Single straight   Wound management:  Probed and deloculated   Drainage:  Purulent and bloody   Drainage amount:  Moderate   Wound treatment:  Wound left open   Packing  materials:  None Post-procedure details:    Procedure completion:  Tolerated    MEDICATIONS ORDERED IN ED: Medications  HYDROcodone-acetaminophen (NORCO/VICODIN) 5-325 MG per tablet 1 tablet (1 tablet Oral Given 04/29/23 1241)  HYDROcodone-acetaminophen (NORCO/VICODIN) 5-325 MG per tablet 1 tablet (1 tablet Oral Given 04/29/23 1320)     IMPRESSION / MDM / ASSESSMENT AND PLAN / ED COURSE  I reviewed the triage vital signs and the nursing notes.  Differential diagnosis includes, but is not limited to, abscess, cellulitis, lipoma, other soft tissue abnormality  Patient's presentation is most consistent with acute complicated illness / injury requiring diagnostic workup.  62 year old presenting to the emergency department for recurrent upper back swelling.  Ultrasound findings consistent with abscess.   When I&D previously performed, minimal output.  I&D was performed as described above.  There was moderate return of purulent material.  Do think there is some component of associated cellulitis.  Seem to have worsening on Keflex Olu coverage.  Will DC with prescription for Bactrim given associated purulence.  Strict return precautions provided.  Patient discharged stable condition.      FINAL CLINICAL IMPRESSION(S) / ED DIAGNOSES   Final diagnoses:  Abscess of back     Rx / DC Orders   ED Discharge Orders     None        Note:  This document was prepared using Dragon voice recognition software and may include unintentional dictation errors.   Trinna Post, MD 04/29/23 [phone]

## 2023-04-29 NOTE — ED Triage Notes (Signed)
Blackhead on back that had swollen up.  Seen through [facility] on Friday and and an I+D was done, only some blood came out.  Started on antibiotic.  Currently taking antibiotic.  States area 'swolled back up' yesterday morning.

## 2023-04-29 NOTE — Discharge Instructions (Addendum)
You were seen in the ER today for evaluation of swelling in your back.  This was consistent with an abscess.  We did perform an incision and drainage with moderate return of pus.  I have sent a prescription for an additional antibiotic to take.  Please finish your prior prescription and start taking this simultaneously.   Do not drive or operate machinery when taking these.  Please take your prescribed home pain medicine as needed for breakthrough pain.  Follow-up primary care doctor within a few days for reevaluation.  Please also arrange follow-up of with general surgery using your existing referral.  Return to ER for any new or worsening symptoms.

## 2023-04-30 ENCOUNTER — Telehealth: Payer: Self-pay | Admitting: Emergency Medicine

## 2023-04-30 MED ORDER — HYDROCODONE-ACETAMINOPHEN 5-325 MG PO TABS: 1.0000 | ORAL_TABLET | Freq: Four times a day (QID) | ORAL | 0 refills | Status: DC | PRN

## 2023-04-30 MED ORDER — HYDROCODONE-ACETAMINOPHEN 5-325 MG PO TABS: 1.0000 | ORAL_TABLET | Freq: Four times a day (QID) | ORAL | 0 refills | Status: AC | PRN

## 2023-04-30 MED ORDER — SULFAMETHOXAZOLE-TRIMETHOPRIM 800-160 MG PO TABS: 1.0000 | ORAL_TABLET | Freq: Two times a day (BID) | ORAL | 0 refills | Status: DC

## 2023-04-30 MED ORDER — SULFAMETHOXAZOLE-TRIMETHOPRIM 800-160 MG PO TABS: 1.0000 | ORAL_TABLET | Freq: Two times a day (BID) | ORAL | 0 refills | Status: AC

## 2023-04-30 NOTE — Telephone Encounter (Signed)
Prescriptions not initially send to correct pharmacy. Abd and pain meds sent to patient requested pharmacy.

## 2024-01-28 ENCOUNTER — Emergency Department

## 2024-01-28 ENCOUNTER — Other Ambulatory Visit: Payer: Self-pay

## 2024-01-28 ENCOUNTER — Emergency Department
Admission: EM | Admit: 2024-01-28 | Discharge: 2024-01-28 | Disposition: A | Discharge status: Home / Self Care | Attending: Emergency Medicine | Admitting: Emergency Medicine

## 2024-01-28 DIAGNOSIS — Z79899 Other long term (current) drug therapy: Secondary | ICD-10-CM | POA: Diagnosis not present

## 2024-01-28 DIAGNOSIS — J40 Bronchitis, not specified as acute or chronic: Secondary | ICD-10-CM | POA: Diagnosis not present

## 2024-01-28 DIAGNOSIS — E119 Type 2 diabetes mellitus without complications: Secondary | ICD-10-CM | POA: Insufficient documentation

## 2024-01-28 DIAGNOSIS — E669 Obesity, unspecified: Secondary | ICD-10-CM | POA: Diagnosis not present

## 2024-01-28 DIAGNOSIS — J0100 Acute maxillary sinusitis, unspecified: Secondary | ICD-10-CM | POA: Insufficient documentation

## 2024-01-28 DIAGNOSIS — R059 Cough, unspecified: Secondary | ICD-10-CM | POA: Diagnosis present

## 2024-01-28 DIAGNOSIS — G4733 Obstructive sleep apnea (adult) (pediatric): Secondary | ICD-10-CM | POA: Insufficient documentation

## 2024-01-28 DIAGNOSIS — I10 Essential (primary) hypertension: Secondary | ICD-10-CM | POA: Diagnosis not present

## 2024-01-28 LAB — RESP PANEL BY RT-PCR (RSV, FLU A&B, COVID)  RVPGX2
Influenza A by PCR: NEGATIVE
Influenza B by PCR: NEGATIVE
Resp Syncytial Virus by PCR: NEGATIVE
SARS Coronavirus 2 by RT PCR: NEGATIVE

## 2024-01-28 MED ORDER — ALBUTEROL SULFATE HFA 108 (90 BASE) MCG/ACT IN AERS: 2.0000 | INHALATION_SPRAY | RESPIRATORY_TRACT | 0 refills | Status: AC | PRN

## 2024-01-28 MED ORDER — FLUTICASONE PROPIONATE 50 MCG/ACT NA SUSP: 1.0000 | Freq: Every day | NASAL | 1 refills | Status: AC

## 2024-01-28 MED ORDER — AMOXICILLIN-POT CLAVULANATE 875-125 MG PO TABS: 1.0000 | ORAL_TABLET | Freq: Two times a day (BID) | ORAL | 0 refills | Status: AC

## 2024-01-28 MED ORDER — BENZONATATE 100 MG PO CAPS: ORAL_CAPSULE | ORAL | 0 refills | Status: AC

## 2024-01-28 NOTE — ED Provider Notes (Signed)
 [facility] Emergency Department Provider Note     Event Date/Time   First MD Initiated Contact with Patient 01/28/24 1733     (approximate)   History   Facial Pain   HPI  Samantha Burgess is a 63 y.o. female with a history of HTN, DM, GERD, obesity, OSA, HLD, presents to the ED noting a week and a half sinus pressure.  She would also endorse a productive cough and sinus drainage.  No frank fevers are reported.  No nausea, vomiting, bowel changes noted at this time.  Physical Exam   Triage Vital Signs: ED Triage Vitals  Encounter Vitals Group     BP 01/28/24 1525 92/67     Systolic BP Percentile --      Diastolic BP Percentile --      Pulse Rate 01/28/24 1525 (!) 105     Resp 01/28/24 1525 18     Temp 01/28/24 1525 98.3 F (36.8 C)     Temp Source 01/28/24 1525 Oral     SpO2 01/28/24 1525 95 %     Weight 01/28/24 1523 235 lb (106.6 kg)     Height 01/28/24 1523 5\' 3"  (1.6 m)     Head Circumference --      Peak Flow --      Pain Score 01/28/24 1525 0     Pain Loc --      Pain Education --      Exclude from Growth Chart --     Most recent vital signs: Vitals:   01/28/24 1525  BP: 92/67  Pulse: (!) 105  Resp: 18  Temp: 98.3 F (36.8 C)  SpO2: 95%    General Awake, no distress. NAD HEENT NCAT. PERRL. EOMI. No rhinorrhea.  Turbinates are pink, moist, and not enlarged.  Mucous membranes are moist.  TMs intact bilaterally with retraction and serous fluid noted.  Pressure to palpation over the frontal and maxillary sinuses bilaterally. CV:  Good peripheral perfusion. RRR RESP:  Normal effort. Mild rhonchi noted bilaterally ABD:  No distention.    ED Results / Procedures / Treatments   Labs (all labs ordered are listed, but only abnormal results are displayed) Labs Reviewed  RESP PANEL BY RT-PCR (RSV, FLU A&B, COVID)  [id number]     EKG   RADIOLOGY  I personally viewed and evaluated these images as part of my medical decision  making, as well as reviewing the written report by the radiologist.  ED Provider Interpretation: No acute findings on chest x-ray  DG Chest 2 View Result Date: 01/28/2024 CLINICAL DATA:  Cough.  Congestion. EXAM: CHEST - 2 VIEW COMPARISON:  01/16/2016. FINDINGS: Low lung volume. Bilateral lung fields are clear. Bilateral costophrenic angles are clear. Normal cardio-mediastinal silhouette. No acute osseous abnormalities. The soft tissues are within normal limits. IMPRESSION: No active cardiopulmonary disease. Electronically Signed   By: Jules Schick M.D.   On: 01/28/2024 18:12   PROCEDURES:  Critical Care performed: No  Procedures   MEDICATIONS ORDERED IN ED: Medications - No data to display   IMPRESSION / MDM / ASSESSMENT AND PLAN / ED COURSE  I reviewed the triage vital signs and the nursing notes.                              Differential diagnosis includes, but is not limited to, COVID, flu, RSV, viral URI, CAP, bronchitis, sinusitis  Patient's presentation is most  consistent with acute complicated illness / injury requiring diagnostic workup.  Patient's diagnosis is consistent with acute maxillary sinusitis and bronchitis.  Patient no acute respiratory distress at this time.  Viral panel test is negative.  Chest x-ray reviewed by me does not reveal any acute intrathoracic process.  Patient will be discharged home with prescriptions for amoxicillin, albuterol MDI, Tessalon Perles, and Flonase.  Patient may also start OTC daily allergy medicine as discussed.  Patient is to follow up with her primary provider or ENT as discussed as needed or otherwise directed. Patient is given ED precautions to return to the ED for any worsening or new symptoms.   FINAL CLINICAL IMPRESSION(S) / ED DIAGNOSES   Final diagnoses:  Acute non-recurrent maxillary sinusitis  Bronchitis     Rx / DC Orders   ED Discharge Orders          Ordered    amoxicillin-clavulanate (AUGMENTIN) 875-125 MG  tablet  2 times daily        01/28/24 1759    benzonatate (TESSALON PERLES) 100 MG capsule        01/28/24 1759    fluticasone (FLONASE) 50 MCG/ACT nasal spray  Daily        01/28/24 1759    albuterol (VENTOLIN HFA) 108 (90 Base) MCG/ACT inhaler  Every 4 hours PRN        01/28/24 1759             Note:  This document was prepared using Dragon voice recognition software and may include unintentional dictation errors.    Lissa Hoard, PA-C 01/28/24 1936    Dionne Bucy, MD 01/28/24 2208

## 2024-01-28 NOTE — Discharge Instructions (Addendum)
 Your viral panel test is negative but your chest x-ray is still pending at this time.  It appears that it does not show any acute pneumonia.  You are being treated for sinusitis and bronchitis.  The prescription medicines as directed.  Follow-up with your primary provider for ongoing evaluation.  You may follow your pending test chest x-ray result on [facility] MyChart.

## 2024-01-28 NOTE — ED Triage Notes (Signed)
 Patient states sinus pressure, productive cough and runny nose for 1.5 weeks.

## 2024-04-07 ENCOUNTER — Emergency Department

## 2024-04-07 ENCOUNTER — Inpatient Hospital Stay
Admission: EM | Admit: 2024-04-07 | Discharge: 2024-04-12 | DRG: 100 | Disposition: A | Discharge status: Home Health | Attending: Obstetrics and Gynecology | Admitting: Obstetrics and Gynecology

## 2024-04-07 ENCOUNTER — Other Ambulatory Visit: Payer: Self-pay

## 2024-04-07 DIAGNOSIS — Z91148 Patient's other noncompliance with medication regimen for other reason: Secondary | ICD-10-CM

## 2024-04-07 DIAGNOSIS — E785 Hyperlipidemia, unspecified: Secondary | ICD-10-CM | POA: Diagnosis present

## 2024-04-07 DIAGNOSIS — G4733 Obstructive sleep apnea (adult) (pediatric): Secondary | ICD-10-CM | POA: Diagnosis present

## 2024-04-07 DIAGNOSIS — R5381 Other malaise: Secondary | ICD-10-CM | POA: Diagnosis present

## 2024-04-07 DIAGNOSIS — G8384 Todd's paralysis (postepileptic): Secondary | ICD-10-CM | POA: Diagnosis present

## 2024-04-07 DIAGNOSIS — S82009A Unspecified fracture of unspecified patella, initial encounter for closed fracture: Secondary | ICD-10-CM | POA: Insufficient documentation

## 2024-04-07 DIAGNOSIS — Z7984 Long term (current) use of oral hypoglycemic drugs: Secondary | ICD-10-CM

## 2024-04-07 DIAGNOSIS — G40909 Epilepsy, unspecified, not intractable, without status epilepticus: Principal | ICD-10-CM | POA: Diagnosis present

## 2024-04-07 DIAGNOSIS — I161 Hypertensive emergency: Secondary | ICD-10-CM | POA: Diagnosis present

## 2024-04-07 DIAGNOSIS — E1122 Type 2 diabetes mellitus with diabetic chronic kidney disease: Secondary | ICD-10-CM

## 2024-04-07 DIAGNOSIS — N182 Chronic kidney disease, stage 2 (mild): Secondary | ICD-10-CM | POA: Diagnosis present

## 2024-04-07 DIAGNOSIS — R569 Unspecified convulsions: Secondary | ICD-10-CM | POA: Diagnosis not present

## 2024-04-07 DIAGNOSIS — Z833 Family history of diabetes mellitus: Secondary | ICD-10-CM

## 2024-04-07 DIAGNOSIS — E1100 Type 2 diabetes mellitus with hyperosmolarity without nonketotic hyperglycemic-hyperosmolar coma (NKHHC): Secondary | ICD-10-CM

## 2024-04-07 DIAGNOSIS — K219 Gastro-esophageal reflux disease without esophagitis: Secondary | ICD-10-CM | POA: Diagnosis present

## 2024-04-07 DIAGNOSIS — G8929 Other chronic pain: Secondary | ICD-10-CM | POA: Diagnosis present

## 2024-04-07 DIAGNOSIS — Z800 Family history of malignant neoplasm of digestive organs: Secondary | ICD-10-CM

## 2024-04-07 DIAGNOSIS — Z7982 Long term (current) use of aspirin: Secondary | ICD-10-CM

## 2024-04-07 DIAGNOSIS — I7389 Other specified peripheral vascular diseases: Secondary | ICD-10-CM | POA: Insufficient documentation

## 2024-04-07 DIAGNOSIS — I129 Hypertensive chronic kidney disease with stage 1 through stage 4 chronic kidney disease, or unspecified chronic kidney disease: Secondary | ICD-10-CM | POA: Diagnosis present

## 2024-04-07 DIAGNOSIS — Z6831 Body mass index (BMI) 31.0-31.9, adult: Secondary | ICD-10-CM

## 2024-04-07 DIAGNOSIS — E669 Obesity, unspecified: Secondary | ICD-10-CM | POA: Insufficient documentation

## 2024-04-07 DIAGNOSIS — E871 Hypo-osmolality and hyponatremia: Secondary | ICD-10-CM | POA: Diagnosis present

## 2024-04-07 DIAGNOSIS — I10 Essential (primary) hypertension: Secondary | ICD-10-CM

## 2024-04-07 DIAGNOSIS — S82002A Unspecified fracture of left patella, initial encounter for closed fracture: Secondary | ICD-10-CM | POA: Diagnosis present

## 2024-04-07 DIAGNOSIS — R471 Dysarthria and anarthria: Secondary | ICD-10-CM | POA: Diagnosis present

## 2024-04-07 DIAGNOSIS — E66811 Obesity, class 1: Secondary | ICD-10-CM | POA: Diagnosis present

## 2024-04-07 DIAGNOSIS — Z794 Long term (current) use of insulin: Secondary | ICD-10-CM

## 2024-04-07 DIAGNOSIS — E11319 Type 2 diabetes mellitus with unspecified diabetic retinopathy without macular edema: Secondary | ICD-10-CM | POA: Diagnosis present

## 2024-04-07 DIAGNOSIS — W19XXXA Unspecified fall, initial encounter: Secondary | ICD-10-CM | POA: Diagnosis present

## 2024-04-07 DIAGNOSIS — R6510 Systemic inflammatory response syndrome (SIRS) of non-infectious origin without acute organ dysfunction: Secondary | ICD-10-CM | POA: Diagnosis present

## 2024-04-07 DIAGNOSIS — Z79899 Other long term (current) drug therapy: Secondary | ICD-10-CM

## 2024-04-07 DIAGNOSIS — N179 Acute kidney failure, unspecified: Secondary | ICD-10-CM | POA: Diagnosis present

## 2024-04-07 LAB — CBC WITH DIFFERENTIAL/PLATELET
Abs Immature Granulocytes: 0.02 10*3/uL (ref 0.00–0.07)
Basophils Absolute: 0.1 10*3/uL (ref 0.0–0.1)
Basophils Relative: 1 %
Eosinophils Absolute: 0.2 10*3/uL (ref 0.0–0.5)
Eosinophils Relative: 3 %
HCT: 39.9 % (ref 36.0–46.0)
Hemoglobin: 13.1 g/dL (ref 12.0–15.0)
Immature Granulocytes: 0 %
Lymphocytes Relative: 33 %
Lymphs Abs: 2.2 10*3/uL (ref 0.7–4.0)
MCH: 28.1 pg (ref 26.0–34.0)
MCHC: 32.8 g/dL (ref 30.0–36.0)
MCV: 85.4 fL (ref 80.0–100.0)
Monocytes Absolute: 0.6 10*3/uL (ref 0.1–1.0)
Monocytes Relative: 9 %
Neutro Abs: 3.7 10*3/uL (ref 1.7–7.7)
Neutrophils Relative %: 54 %
Platelets: 392 10*3/uL (ref 150–400)
RBC: 4.67 MIL/uL (ref 3.87–5.11)
RDW: 13 % (ref 11.5–15.5)
WBC: 6.8 10*3/uL (ref 4.0–10.5)
nRBC: 0 % (ref 0.0–0.2)

## 2024-04-07 LAB — BLOOD GAS, VENOUS
Acid-Base Excess: 1.2 mmol/L (ref 0.0–2.0)
Bicarbonate: 27.6 mmol/L (ref 20.0–28.0)
O2 Saturation: 67.1 %
Patient temperature: 37
pCO2, Ven: 50 mmHg (ref 44–60)
pH, Ven: 7.35 (ref 7.25–7.43)
pO2, Ven: 40 mmHg (ref 32–45)

## 2024-04-07 LAB — BASIC METABOLIC PANEL WITH GFR
Anion gap: 15 (ref 5–15)
BUN: 18 mg/dL (ref 8–23)
CO2: 22 mmol/L (ref 22–32)
Calcium: 9.3 mg/dL (ref 8.9–10.3)
Chloride: 91 mmol/L — ABNORMAL LOW (ref 98–111)
Creatinine, Ser: 1.18 mg/dL — ABNORMAL HIGH (ref 0.44–1.00)
GFR, Estimated: 52 mL/min — ABNORMAL LOW (ref >60)
Glucose, Bld: 710 mg/dL (ref 70–99)
Potassium: 3.7 mmol/L (ref 3.5–5.1)
Sodium: 128 mmol/L — ABNORMAL LOW (ref 135–145)

## 2024-04-07 LAB — URINALYSIS, ROUTINE W REFLEX MICROSCOPIC
Bilirubin Urine: NEGATIVE
Glucose, UA: >=500 mg/dL — AB
Hgb urine dipstick: NEGATIVE
Ketones, ur: NEGATIVE mg/dL
Leukocytes,Ua: NEGATIVE
Nitrite: NEGATIVE
Protein, ur: 30 mg/dL — AB
Specific Gravity, Urine: 1.029 (ref 1.005–1.030)
pH: 5 (ref 5.0–8.0)

## 2024-04-07 LAB — APTT: aPTT: 25 s (ref 24–36)

## 2024-04-07 LAB — TROPONIN I (HIGH SENSITIVITY): Troponin I (High Sensitivity): 14 ng/L (ref <18)

## 2024-04-07 LAB — PROTIME-INR
INR: 1 (ref 0.8–1.2)
Prothrombin Time: 13.3 s (ref 11.4–15.2)

## 2024-04-07 LAB — ETHANOL: Alcohol, Ethyl (B): <15 mg/dL (ref <15)

## 2024-04-07 MED ORDER — LACTATED RINGERS IV BOLUS
20.0000 mL/kg | Freq: Once | INTRAVENOUS | Status: AC
  Administered 2024-04-08: 2132 mL via INTRAVENOUS

## 2024-04-07 MED ORDER — DEXTROSE 50 % IV SOLN: 0.0000 mL | INTRAVENOUS | Status: DC | PRN

## 2024-04-07 MED ORDER — INSULIN REGULAR(HUMAN) IN NACL 100-0.9 UT/100ML-% IV SOLN
INTRAVENOUS | Status: DC
  Administered 2024-04-08: 13 [IU]/h via INTRAVENOUS
  Administered 2024-04-08: 2.8 [IU]/h via INTRAVENOUS
  Filled 2024-04-07 (×2): qty 100

## 2024-04-07 MED ORDER — NICARDIPINE HCL IN NACL 20-0.86 MG/200ML-% IV SOLN
0.0000 mg/h | INTRAVENOUS | Status: DC
  Administered 2024-04-07 – 2024-04-08 (×3): 5 mg/h via INTRAVENOUS
  Filled 2024-04-07 (×4): qty 200

## 2024-04-07 MED ORDER — LACTATED RINGERS IV SOLN: INTRAVENOUS | Status: AC

## 2024-04-07 MED ORDER — INSULIN ASPART 100 UNIT/ML IJ SOLN
12.0000 [IU] | Freq: Once | INTRAMUSCULAR | Status: DC
Start: 2024-04-07 — End: 2024-04-07

## 2024-04-07 MED ORDER — POTASSIUM CHLORIDE 10 MEQ/100ML IV SOLN
10.0000 meq | INTRAVENOUS | Status: AC
  Administered 2024-04-08 (×2): 10 meq via INTRAVENOUS
  Filled 2024-04-07 (×2): qty 100

## 2024-04-07 MED ORDER — TENECTEPLASE FOR STROKE
PACK | INTRAVENOUS | Status: AC
  Filled 2024-04-07: qty 10

## 2024-04-07 MED ORDER — DEXTROSE IN LACTATED RINGERS 5 % IV SOLN: INTRAVENOUS | Status: AC

## 2024-04-07 NOTE — ED Triage Notes (Addendum)
 Pt to ED via EMS from home, pt had witnessed seizure at home with family. Family reports full body convulsions and foaming at her mouth. Per ems pt was post ictal on arrival. Pt does not have seizure hx. Pt recently had right ey surgery, pt is unsure of when. CBG 127. Pt denies alcohol use states she takes prescribed oxycodone from pain clinic.

## 2024-04-07 NOTE — ED Notes (Signed)
 Pt ambulated to restroom with minimal assistance. Family members at bedside

## 2024-04-07 NOTE — ED Provider Notes (Signed)
 [facility] Provider Note    Event Date/Time   First MD Initiated Contact with Patient 04/07/24 2309     (approximate)   History   Seizures   HPI  Samantha Burgess is a 63 y.o. female brought to the ED via EMS from home with a chief complaint of seizure.  Patient with a history of type 2 diabetes, hypertension, hyperlipidemia, GERD.  No reported seizure history.  Around 9:30 PM she was speaking with a family member when she felt bad, seized up, pointed upstairs and had convulsions of her upper arms and body per family members.  She was unconscious for approximately 30 seconds and foaming at the mouth.  Did not bite tongue or suffer urinary incontinence.  Now with mild headache.  Denies recent fever/chills, neck pain, chest pain, shortness of breath, abdominal pain, nausea, vomiting or dizziness.     Past Medical History   Past Medical History:  Diagnosis Date   Arthritis 2000   DM (diabetes mellitus) (HCC) 2005   GERD (gastroesophageal reflux disease)    HLD (hyperlipidemia)    HTN (hypertension) 2007   Obesity 1999   Sleep apnea 2007     Active Problem List   Patient Active Problem List   Diagnosis Date Noted   Seizure (HCC) 04/08/2024   Dyspepsia and other specified disorders of function of stomach 03/18/2013   Dysphagia 03/18/2013     Past Surgical History   Past Surgical History:  Procedure Laterality Date   CESAREAN SECTION     IR FLUORO GUIDE CV LINE RIGHT  05/10/2017   KNEE ARTHROSCOPY Right    UPPER GASTROINTESTINAL ENDOSCOPY       Home Medications   Prior to Admission medications   Medication Sig Start Date End Date Taking? Authorizing Provider  albuterol  (VENTOLIN  HFA) 108 (90 Base) MCG/ACT inhaler Inhale 2 puffs into the lungs every 4 (four) hours as needed for shortness of breath. 01/28/24   Menshew, Raye Cai, PA-C  amitriptyline (ELAVIL) 10 MG tablet Take 40-50 mg by mouth at bedtime.    [provider]   benzonatate  (TESSALON  PERLES) 100 MG capsule Take 1-2 tabs TID prn cough 01/28/24   Menshew, Jenise V Bacon, PA-C  fluticasone  (FLONASE ) 50 MCG/ACT nasal spray Place 1 spray into both nostrils daily. 01/28/24   Menshew, Raye Cai, PA-C  hydrochlorothiazide (HYDRODIURIL) 25 MG tablet Take 25 mg by mouth daily.    [provider]  insulin aspart protamine- aspart (NOVOLOG MIX 70/30) (70-30) 100 UNIT/ML injection Inject into the skin.    [provider]  insulin detemir (LEVEMIR) 100 UNIT/ML injection Inject 30 Units into the skin at bedtime.    [provider]  insulin lispro (HUMALOG) 100 UNIT/ML injection Inject 50 Units into the skin 2 (two) times daily.    [provider]  oxyCODONE-acetaminophen  (PERCOCET) 10-325 MG per tablet Take 1 tablet by mouth 4 (four) times daily as needed for pain.    [provider]  ramipril (ALTACE) 10 MG capsule Take 10 mg by mouth daily.    [provider]  simvastatin (ZOCOR) 40 MG tablet Take 40 mg by mouth daily.    [provider]  sitaGLIPtan-metformin (JANUMET) 50-1000 MG per tablet Take 1 tablet by mouth 2 (two) times daily with a meal.    [provider]     Allergies  Patient has no known allergies.   Family History   Family History  Problem Relation Age of  Onset   Esophageal cancer Paternal Grandmother    Diabetes Mother    Diabetes Maternal Grandmother    Colon cancer Neg Hx    Rectal cancer Neg Hx    Stomach cancer Neg Hx      Physical Exam  Triage Vital Signs: ED Triage Vitals  Encounter Vitals Group     BP 04/07/24 2206 (!) 164/92     Systolic BP Percentile --      Diastolic BP Percentile --      Pulse Rate 04/07/24 2206 (!) 110     Resp 04/07/24 2206 20     Temp 04/07/24 2206 98.2 F (36.8 C)     Temp Source 04/07/24 2206 Oral     SpO2 04/07/24 2206 96 %     Weight 04/07/24 2204 235 lb (106.6 kg)     Height 04/07/24 2204 5\' 3"  (1.6 m)     Head  Circumference --      Peak Flow --      Pain Score 04/07/24 2204 8     Pain Loc --      Pain Education --      Exclude from Growth Chart --     Updated Vital Signs: BP (!) 169/82   Pulse (!) 118   Temp 97.7 F (36.5 C) (Axillary)   Resp (!) 23   Ht 5\' 3"  (1.6 m)   Wt 75.1 kg   LMP 03/30/2013   SpO2 98%   BMI 29.33 kg/m    General: Awake, mild distress.  CV:  RRR.  Good peripheral perfusion.  Resp:  Normal effort.  CTAB. Abd:  Nontender.  No distention.  Other:  Alert and oriented x 3.  Mild left facial droop noted.  Dysarthria noted.  Left upper and lower extremity hemiplegia.   ED Results / Procedures / Treatments  Labs (all labs ordered are listed, but only abnormal results are displayed) Labs Reviewed  BASIC METABOLIC PANEL WITH GFR - Abnormal; Notable for the following components:      Result Value   Sodium 128 (*)    Chloride 91 (*)    Glucose, Bld 710 (*)    Creatinine, Ser 1.18 (*)    GFR, Estimated 52 (*)    All other components within normal limits  URINALYSIS, ROUTINE W REFLEX MICROSCOPIC - Abnormal; Notable for the following components:   Color, Urine STRAW (*)    APPearance HAZY (*)    Glucose, UA >=500 (*)    Protein, ur 30 (*)    Bacteria, UA RARE (*)    All other components within normal limits  URINE DRUG SCREEN, QUALITATIVE ([facility] ONLY) - Abnormal; Notable for the following components:   Tricyclic, Ur Screen POSITIVE (*)    All other components within normal limits  BETA-HYDROXYBUTYRIC ACID - Abnormal; Notable for the following components:   Beta-Hydroxybutyric Acid 0.87 (*)    All other components within normal limits  GLUCOSE, CAPILLARY - Abnormal; Notable for the following components:   Glucose-Capillary 212 (*)    All other components within normal limits  GLUCOSE, CAPILLARY - Abnormal; Notable for the following components:   Glucose-Capillary 225 (*)    All other components within normal limits  GLUCOSE, CAPILLARY - Abnormal; Notable  for the following components:   Glucose-Capillary 270 (*)    All other components within normal limits  GLUCOSE, CAPILLARY - Abnormal; Notable for the following components:   Glucose-Capillary 312 (*)    All other components within normal limits  CBG MONITORING, ED - Abnormal; Notable for the following components:   Glucose-Capillary 517 (*)    All other components within normal limits  CBG MONITORING, ED - Abnormal; Notable for the following components:   Glucose-Capillary 470 (*)    All other components within normal limits  CBG MONITORING, ED - Abnormal; Notable for the following components:   Glucose-Capillary 338 (*)    All other components within normal limits  CBG MONITORING, ED - Abnormal; Notable for the following components:   Glucose-Capillary 169 (*)    All other components within normal limits  MRSA NEXT GEN BY PCR, NASAL  CBC WITH DIFFERENTIAL/PLATELET  ETHANOL  PROTIME-INR  APTT  BLOOD GAS, VENOUS  TROPONIN I (HIGH SENSITIVITY)     EKG  ED ECG REPORT I, Hargun Spurling J, the attending physician, personally viewed and interpreted this ECG.   Date: 04/08/2024  EKG Time: 2206  Rate: 111  Rhythm: sinus tachycardia  Axis: Normal  Intervals:none  ST&T Change: Nonspecific    RADIOLOGY I have independently visualized interpreted patient's imaging study as well as noted the radiology interpretation:  CT head: No ICH  Official radiology report(s): CT Angio Head Neck W WO CM (CODE STROKE) Result Date: 04/08/2024 CLINICAL DATA:  Initial evaluation for acute neuro deficit, stroke. EXAM: CT ANGIOGRAPHY HEAD AND NECK CT PERFUSION BRAIN TECHNIQUE: Multidetector CT imaging of the head and neck was performed using the standard protocol during bolus administration of intravenous contrast. Multiplanar CT image reconstructions and MIPs were obtained to evaluate the vascular anatomy. Carotid stenosis measurements (when applicable) are obtained utilizing NASCET criteria, using  the distal internal carotid diameter as the denominator. Multiphase CT imaging of the brain was performed following IV bolus contrast injection. Subsequent parametric perfusion maps were calculated using RAPID software. RADIATION DOSE REDUCTION: This exam was performed according to the departmental dose-optimization program which includes automated exposure control, adjustment of the mA and/or kV according to patient size and/or use of iterative reconstruction technique. CONTRAST:  100mL OMNIPAQUE IOHEXOL 350 MG/ML SOLN COMPARISON:  Prior CT from 04/07/2024. FINDINGS: CTA NECK FINDINGS Aortic arch: Standard branching. Imaged portion shows no evidence of aneurysm or dissection. No significant stenosis of the major arch vessel origins. Right carotid system: No evidence of dissection, stenosis (50% or greater) or occlusion. Left carotid system: No evidence of dissection, stenosis (50% or greater) or occlusion. Vertebral arteries: Codominant. No evidence of dissection, stenosis (50% or greater) or occlusion. Skeleton: No worrisome osseous lesions. Other neck: No other acute finding. Upper chest: No other acute finding. Review of the MIP images confirms the above findings CTA HEAD FINDINGS Anterior circulation: Both internal carotid arteries widely patent to the termini without stenosis. A1 segments widely patent. Normal anterior communicating artery complex. Both anterior cerebral arteries widely patent to their distal aspects without stenosis. No M1 stenosis or occlusion. Normal MCA bifurcations. Distal MCA branches well perfused and symmetric. Posterior circulation: Both V4 segments patent without stenosis. Both PICA patent at their origins. 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 Basilar patent without stenosis. Superior cerebellar and posterior cerebral arteries patent bilaterally. Venous sinuses: Patent allowing for timing the contrast bolus. Anatomic variants: None significant.  No  aneurysm. Review of the MIP images confirms the above findings CT Brain Perfusion Findings: CBF (<30%) Volume: 0mL Perfusion (Tmax>6.0s) volume: 4mL Mismatch Volume: 4mL Infarction Location:Negative CT perfusion with no evidence for acute core infarct. 4 mL area of delayed perfusion overlies the skull base, consistent with artifact. No other perfusion  abnormality. IMPRESSION: 1. Negative CTA of the head and neck. No large vessel occlusion or other emergent finding. No hemodynamically significant or correctable stenosis. 2. Negative CT perfusion with no evidence for acute ischemia or other perfusion abnormality. Electronically Signed   By: Virgia Griffins M.D.   On: 04/08/2024 01:20   CT CEREBRAL PERFUSION W CONTRAST Result Date: 04/08/2024 CLINICAL DATA:  Initial evaluation for acute neuro deficit, stroke. EXAM: CT ANGIOGRAPHY HEAD AND NECK CT PERFUSION BRAIN TECHNIQUE: Multidetector CT imaging of the head and neck was performed using the standard protocol during bolus administration of intravenous contrast. Multiplanar CT image reconstructions and MIPs were obtained to evaluate the vascular anatomy. Carotid stenosis measurements (when applicable) are obtained utilizing NASCET criteria, using the distal internal carotid diameter as the denominator. Multiphase CT imaging of the brain was performed following IV bolus contrast injection. Subsequent parametric perfusion maps were calculated using RAPID software. RADIATION DOSE REDUCTION: This exam was performed according to the departmental dose-optimization program which includes automated exposure control, adjustment of the mA and/or kV according to patient size and/or use of iterative reconstruction technique. CONTRAST:  100mL OMNIPAQUE IOHEXOL 350 MG/ML SOLN COMPARISON:  Prior CT from 04/07/2024. FINDINGS: CTA NECK FINDINGS Aortic arch: Standard branching. Imaged portion shows no evidence of aneurysm or dissection. No significant stenosis of the major arch  vessel origins. Right carotid system: No evidence of dissection, stenosis (50% or greater) or occlusion. Left carotid system: No evidence of dissection, stenosis (50% or greater) or occlusion. Vertebral arteries: Codominant. No evidence of dissection, stenosis (50% or greater) or occlusion. Skeleton: No worrisome osseous lesions. Other neck: No other acute finding. Upper chest: No other acute finding. Review of the MIP images confirms the above findings CTA HEAD FINDINGS Anterior circulation: Both internal carotid arteries widely patent to the termini without stenosis. A1 segments widely patent. Normal anterior communicating artery complex. Both anterior cerebral arteries widely patent to their distal aspects without stenosis. No M1 stenosis or occlusion. Normal MCA bifurcations. Distal MCA branches well perfused and symmetric. Posterior circulation: Both V4 segments patent without stenosis. Both PICA patent at their origins. 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 Basilar patent without stenosis. Superior cerebellar and posterior cerebral arteries patent bilaterally. Venous sinuses: Patent allowing for timing the contrast bolus. Anatomic variants: None significant.  No aneurysm. Review of the MIP images confirms the above findings CT Brain Perfusion Findings: CBF (<30%) Volume: 0mL Perfusion (Tmax>6.0s) volume: 4mL Mismatch Volume: 4mL Infarction Location:Negative CT perfusion with no evidence for acute core infarct. 4 mL area of delayed perfusion overlies the skull base, consistent with artifact. No other perfusion abnormality. IMPRESSION: 1. Negative CTA of the head and neck. No large vessel occlusion or other emergent finding. No hemodynamically significant or correctable stenosis. 2. Negative CT perfusion with no evidence for acute ischemia or other perfusion abnormality. Electronically Signed   By: Virgia Griffins M.D.   On: 04/08/2024 01:20   CT Head Wo Contrast Result  Date: 04/07/2024 CLINICAL DATA:  New onset seizure. EXAM: CT HEAD WITHOUT CONTRAST TECHNIQUE: Contiguous axial images were obtained from the base of the skull through the vertex without intravenous contrast. RADIATION DOSE REDUCTION: This exam was performed according to the departmental dose-optimization program which includes automated exposure control, adjustment of the mA and/or kV according to patient size and/or use of iterative reconstruction technique. COMPARISON:  None Available. FINDINGS: Brain: There is mild cerebral atrophy with widening of the extra-axial spaces and ventricular dilatation. There are areas of  decreased attenuation within the white matter tracts of the supratentorial brain, consistent with microvascular disease changes. Vascular: No hyperdense vessel or unexpected calcification. Skull: Normal. Negative for fracture or focal lesion. Sinuses/Orbits: The right lens is not clearly identified. Other: None. IMPRESSION: 1. 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 2. No acute intracranial abnormality. 3. The right lens is not clearly identified. Correlation with the patient's surgical history is recommended. Electronically Signed   By: Virgle Grime M.D.   On: 04/07/2024 22:31     PROCEDURES:  Critical Care performed: Yes, see critical care procedure note(s)  CRITICAL CARE Performed by: Norlene Beavers   Total critical care time: 45 minutes  Critical care time was exclusive of separately billable procedures and treating other patients.  Critical care was necessary to treat or prevent imminent or life-threatening deterioration.  Critical care was time spent personally by me on the following activities: development of treatment plan with patient and/or surrogate as well as nursing, discussions with consultants, evaluation of patient's response to treatment, examination of patient, obtaining history from patient or surrogate, ordering and  performing treatments and interventions, ordering and review of laboratory studies, ordering and review of radiographic studies, pulse oximetry and re-evaluation of patient's condition.   Aaron Aas1-3 Lead EKG Interpretation  Performed by: Norlene Beavers, MD Authorized by: Norlene Beavers, MD     Interpretation: normal     ECG rate:  95   ECG rate assessment: normal     Rhythm: sinus rhythm     Ectopy: none     Conduction: normal   Comments:     Patient placed on cardiac monitor to evaluate for arrhythmias  NIH Stroke Scale  Interval: Baseline Time: 6:26 AM Person Administering Scale: [place] Becvar J  Administer stroke scale items in the order listed. Record performance in each category after each subscale exam. Do not go back and change scores. Follow directions provided for each exam technique. Scores should reflect what the patient does, not what the clinician thinks the patient can do. The clinician should record answers while administering the exam and work quickly. Except where indicated, the patient should not be coached (i.e., repeated requests to patient to make a special effort).   1a  Level of consciousness: 0=alert; keenly responsive  1b. LOC questions:  0=Performs both tasks correctly  1c. LOC commands: 0=Performs both tasks correctly  2.  Best Gaze: 0=normal  3.  Visual: 0=No visual loss  4. Facial Palsy: 1=Minor paralysis (flattened nasolabial fold, asymmetric on smiling)  5a.  Motor left arm: 3=No effort against gravity, limb falls  5b.  Motor right arm: 0=No drift, limb holds 90 (or 45) degrees for full 10 seconds  6a. motor left leg: 3=No effort against gravity, limb falls  6b  Motor right leg:  0=No drift, limb holds 90 (or 45) degrees for full 10 seconds  7. Limb Ataxia: 0=Absent  8.  Sensory: 0=Normal; no sensory loss  9. Best Language:  0=No aphasia, normal  10. Dysarthria: 1=Mild to moderate, patient slurs at least some words and at worst, can be understood with some  difficulty  11. Extinction and Inattention: 0=No abnormality  12. Distal motor function: 0=Normal   Total:   8     MEDICATIONS ORDERED IN ED: Medications  nicardipine (CARDENE) 20mg  in 0.86% saline 200ml IV infusion (0.1 mg/ml) (5 mg/hr Intravenous Infusion Verify 04/08/24 0600)  insulin regular, human (MYXREDLIN) 100 units/ 100 mL infusion (6.5 Units/hr Intravenous Rate/Dose Change  04/08/24 0538)  lactated ringers infusion (0 mLs Intravenous Hold 04/08/24 0009)  dextrose 5 % in lactated ringers infusion ( Intravenous Infusion Verify 04/08/24 0600)  dextrose 50 % solution 0-50 mL (has no administration in time range)  tenecteplase (TNKASE) 50 MG injection for Stroke (  Not Given 04/08/24 0022)  Chlorhexidine  Gluconate Cloth 2 % PADS 6 each (has no administration in time range)  lactated ringers bolus 2,132 mL (0 mLs Intravenous Stopped 04/08/24 0222)  potassium chloride 10 mEq in 100 mL IVPB (0 mEq Intravenous Stopped 04/08/24 0230)  iohexol (OMNIPAQUE) 350 MG/ML injection 100 mL (100 mLs Intravenous Contrast Given 04/08/24 0047)  morphine (PF) 2 MG/ML injection 2 mg (2 mg Intravenous Given 04/08/24 0122)  ondansetron (ZOFRAN) injection 4 mg (4 mg Intravenous Given 04/08/24 0122)  LORazepam (ATIVAN) injection 0.5 mg (0.5 mg Intravenous Given 04/08/24 0154)     IMPRESSION / MDM / ASSESSMENT AND PLAN / ED COURSE  I reviewed the triage vital signs and the nursing notes.                             63 year old female presenting with new onset seizure.  Differential diagnosis includes but is not limited to CVA, ICH, metabolic, infectious etiologies, etc.  I personally reviewed patient's records and note an ophthalmology office visit on 03/06/2024 for follow-up right retinal detachment status post surgery and 2024, pseudophakia of right eye.  Patient's presentation is most consistent with acute presentation with potential threat to life or bodily function.  The patient is on the cardiac monitor to  evaluate for evidence of arrhythmia and/or significant heart rate changes.  CT head is negative for intracranial hemorrhage.  Laboratory results significant for hyponatremia with sodium 128, hyperglycemia without elevation of anion gap.  Glucose of 710, AKI with creatinine 1.18.  Concern for left-sided hemiparesis, LWK 9:30 PM.  Clinical Course as of 04/08/24 0626  Tue Apr 07, 2024  2324 BP 153/116 - ED Code Stroke initiated [JS]  Wed Apr 08, 2024  0029 Appreciate teleneurology consult.  Does not recommend TNKase.  Likely symptoms secondary to postictal state status post seizure and hyperglycemia.  Recommend CTA, CT perfusion study.  Anticipate hospitalization.  Blood pressure improved on Cardene drip 156/95. [JS]  0123 CTA head/neck and CT perfusion study negative.  Will consult hospitalist services for evaluation and admission. [JS]    Clinical Course User Index [JS] Norlene Beavers, MD     FINAL CLINICAL IMPRESSION(S) / ED DIAGNOSES   Final diagnoses:  Seizure (HCC)  Type 2 diabetes mellitus with hyperosmolar hyperglycemic state (HHS) (HCC)  Hypertension, unspecified type     Rx / DC Orders   ED Discharge Orders     None        Note:  This document was prepared using Dragon voice recognition software and may include unintentional dictation errors.   Corinne Goucher J, MD 04/08/24 [id number]

## 2024-04-07 NOTE — ED Notes (Signed)
 Pt to CT

## 2024-04-08 ENCOUNTER — Emergency Department

## 2024-04-08 ENCOUNTER — Inpatient Hospital Stay

## 2024-04-08 ENCOUNTER — Inpatient Hospital Stay
Admit: 2024-04-08 | Discharge: 2024-04-08 | Disposition: A | Discharge status: Home / Self Care | Attending: Internal Medicine | Admitting: Internal Medicine

## 2024-04-08 DIAGNOSIS — Z7984 Long term (current) use of oral hypoglycemic drugs: Secondary | ICD-10-CM | POA: Diagnosis not present

## 2024-04-08 DIAGNOSIS — G40909 Epilepsy, unspecified, not intractable, without status epilepticus: Secondary | ICD-10-CM | POA: Diagnosis present

## 2024-04-08 DIAGNOSIS — G039 Meningitis, unspecified: Secondary | ICD-10-CM | POA: Diagnosis not present

## 2024-04-08 DIAGNOSIS — S82002A Unspecified fracture of left patella, initial encounter for closed fracture: Secondary | ICD-10-CM | POA: Diagnosis present

## 2024-04-08 DIAGNOSIS — N179 Acute kidney failure, unspecified: Secondary | ICD-10-CM | POA: Diagnosis present

## 2024-04-08 DIAGNOSIS — R739 Hyperglycemia, unspecified: Secondary | ICD-10-CM | POA: Diagnosis not present

## 2024-04-08 DIAGNOSIS — Z794 Long term (current) use of insulin: Secondary | ICD-10-CM | POA: Diagnosis not present

## 2024-04-08 DIAGNOSIS — G4733 Obstructive sleep apnea (adult) (pediatric): Secondary | ICD-10-CM | POA: Diagnosis present

## 2024-04-08 DIAGNOSIS — G8929 Other chronic pain: Secondary | ICD-10-CM | POA: Diagnosis present

## 2024-04-08 DIAGNOSIS — N182 Chronic kidney disease, stage 2 (mild): Secondary | ICD-10-CM | POA: Diagnosis present

## 2024-04-08 DIAGNOSIS — G459 Transient cerebral ischemic attack, unspecified: Secondary | ICD-10-CM

## 2024-04-08 DIAGNOSIS — G8384 Todd's paralysis (postepileptic): Secondary | ICD-10-CM | POA: Diagnosis present

## 2024-04-08 DIAGNOSIS — R6510 Systemic inflammatory response syndrome (SIRS) of non-infectious origin without acute organ dysfunction: Secondary | ICD-10-CM | POA: Diagnosis present

## 2024-04-08 DIAGNOSIS — R569 Unspecified convulsions: Secondary | ICD-10-CM | POA: Diagnosis present

## 2024-04-08 DIAGNOSIS — R509 Fever, unspecified: Secondary | ICD-10-CM | POA: Diagnosis not present

## 2024-04-08 DIAGNOSIS — D729 Disorder of white blood cells, unspecified: Secondary | ICD-10-CM | POA: Diagnosis not present

## 2024-04-08 DIAGNOSIS — Z7982 Long term (current) use of aspirin: Secondary | ICD-10-CM | POA: Diagnosis not present

## 2024-04-08 DIAGNOSIS — E11319 Type 2 diabetes mellitus with unspecified diabetic retinopathy without macular edema: Secondary | ICD-10-CM | POA: Diagnosis present

## 2024-04-08 DIAGNOSIS — R471 Dysarthria and anarthria: Secondary | ICD-10-CM | POA: Diagnosis present

## 2024-04-08 DIAGNOSIS — D72829 Elevated white blood cell count, unspecified: Secondary | ICD-10-CM | POA: Diagnosis not present

## 2024-04-08 DIAGNOSIS — G9340 Encephalopathy, unspecified: Secondary | ICD-10-CM

## 2024-04-08 DIAGNOSIS — E1122 Type 2 diabetes mellitus with diabetic chronic kidney disease: Secondary | ICD-10-CM | POA: Diagnosis present

## 2024-04-08 DIAGNOSIS — I161 Hypertensive emergency: Secondary | ICD-10-CM | POA: Diagnosis present

## 2024-04-08 DIAGNOSIS — E785 Hyperlipidemia, unspecified: Secondary | ICD-10-CM | POA: Diagnosis present

## 2024-04-08 DIAGNOSIS — W19XXXA Unspecified fall, initial encounter: Secondary | ICD-10-CM | POA: Diagnosis present

## 2024-04-08 DIAGNOSIS — E871 Hypo-osmolality and hyponatremia: Secondary | ICD-10-CM | POA: Diagnosis present

## 2024-04-08 DIAGNOSIS — E1100 Type 2 diabetes mellitus with hyperosmolarity without nonketotic hyperglycemic-hyperosmolar coma (NKHHC): Secondary | ICD-10-CM | POA: Diagnosis present

## 2024-04-08 DIAGNOSIS — K219 Gastro-esophageal reflux disease without esophagitis: Secondary | ICD-10-CM | POA: Diagnosis present

## 2024-04-08 DIAGNOSIS — E872 Acidosis, unspecified: Secondary | ICD-10-CM

## 2024-04-08 DIAGNOSIS — I129 Hypertensive chronic kidney disease with stage 1 through stage 4 chronic kidney disease, or unspecified chronic kidney disease: Secondary | ICD-10-CM | POA: Diagnosis present

## 2024-04-08 DIAGNOSIS — R5381 Other malaise: Secondary | ICD-10-CM | POA: Diagnosis present

## 2024-04-08 DIAGNOSIS — Z6831 Body mass index (BMI) 31.0-31.9, adult: Secondary | ICD-10-CM | POA: Diagnosis not present

## 2024-04-08 DIAGNOSIS — I674 Hypertensive encephalopathy: Secondary | ICD-10-CM | POA: Diagnosis not present

## 2024-04-08 DIAGNOSIS — Z833 Family history of diabetes mellitus: Secondary | ICD-10-CM | POA: Diagnosis not present

## 2024-04-08 DIAGNOSIS — E66811 Obesity, class 1: Secondary | ICD-10-CM | POA: Diagnosis present

## 2024-04-08 LAB — LIPID PANEL
Cholesterol: 203 mg/dL — ABNORMAL HIGH (ref 0–200)
HDL: 55 mg/dL (ref >40)
LDL Cholesterol: 112 mg/dL — ABNORMAL HIGH (ref 0–99)
Total CHOL/HDL Ratio: 3.7 ratio
Triglycerides: 178 mg/dL — ABNORMAL HIGH (ref <150)
VLDL: 36 mg/dL (ref 0–40)

## 2024-04-08 LAB — CBG MONITORING, ED
Glucose-Capillary: 169 mg/dL — ABNORMAL HIGH (ref 70–99)
Glucose-Capillary: 338 mg/dL — ABNORMAL HIGH (ref 70–99)
Glucose-Capillary: 470 mg/dL — ABNORMAL HIGH (ref 70–99)
Glucose-Capillary: 517 mg/dL (ref 70–99)

## 2024-04-08 LAB — GLUCOSE, CAPILLARY
Glucose-Capillary: 212 mg/dL — ABNORMAL HIGH (ref 70–99)
Glucose-Capillary: 225 mg/dL — ABNORMAL HIGH (ref 70–99)
Glucose-Capillary: 270 mg/dL — ABNORMAL HIGH (ref 70–99)
Glucose-Capillary: 312 mg/dL — ABNORMAL HIGH (ref 70–99)
Glucose-Capillary: 267 mg/dL — ABNORMAL HIGH (ref 70–99)
Glucose-Capillary: 239 mg/dL — ABNORMAL HIGH (ref 70–99)
Glucose-Capillary: 242 mg/dL — ABNORMAL HIGH (ref 70–99)
Glucose-Capillary: 245 mg/dL — ABNORMAL HIGH (ref 70–99)
Glucose-Capillary: 238 mg/dL — ABNORMAL HIGH (ref 70–99)
Glucose-Capillary: 242 mg/dL — ABNORMAL HIGH (ref 70–99)
Glucose-Capillary: 227 mg/dL — ABNORMAL HIGH (ref 70–99)
Glucose-Capillary: 197 mg/dL — ABNORMAL HIGH (ref 70–99)
Glucose-Capillary: 216 mg/dL — ABNORMAL HIGH (ref 70–99)
Glucose-Capillary: 266 mg/dL — ABNORMAL HIGH (ref 70–99)
Glucose-Capillary: 342 mg/dL — ABNORMAL HIGH (ref 70–99)

## 2024-04-08 LAB — BLOOD GAS, VENOUS
Acid-Base Excess: 2.7 mmol/L — ABNORMAL HIGH (ref 0.0–2.0)
Acid-Base Excess: 5.3 mmol/L — ABNORMAL HIGH (ref 0.0–2.0)
Bicarbonate: 27.9 mmol/L (ref 20.0–28.0)
Bicarbonate: 31.1 mmol/L — ABNORMAL HIGH (ref 20.0–28.0)
O2 Saturation: 71.8 %
O2 Saturation: 38.9 %
Patient temperature: 37
Patient temperature: 37
pCO2, Ven: 44 mmHg (ref 44–60)
pCO2, Ven: 49 mmHg (ref 44–60)
pH, Ven: 7.41 (ref 7.25–7.43)
pH, Ven: 7.41 (ref 7.25–7.43)
pO2, Ven: 43 mmHg (ref 32–45)

## 2024-04-08 LAB — URINALYSIS, COMPLETE (UACMP) WITH MICROSCOPIC
Bacteria, UA: NONE SEEN
Bilirubin Urine: NEGATIVE
Glucose, UA: >=500 mg/dL — AB
Hgb urine dipstick: NEGATIVE
Ketones, ur: 20 mg/dL — AB
Leukocytes,Ua: NEGATIVE
Nitrite: NEGATIVE
Protein, ur: >=300 mg/dL — AB
Specific Gravity, Urine: 1.025 (ref 1.005–1.030)
Squamous Epithelial / HPF: 0 /HPF (ref 0–5)
pH: 7 (ref 5.0–8.0)

## 2024-04-08 LAB — RESPIRATORY PANEL BY PCR

## 2024-04-08 LAB — CBC
HCT: 40.1 % (ref 36.0–46.0)
Hemoglobin: 13.2 g/dL (ref 12.0–15.0)
MCH: 28 pg (ref 26.0–34.0)
MCHC: 32.9 g/dL (ref 30.0–36.0)
MCV: 85 fL (ref 80.0–100.0)
Platelets: 402 10*3/uL — ABNORMAL HIGH (ref 150–400)
RBC: 4.72 MIL/uL (ref 3.87–5.11)
RDW: 12.9 % (ref 11.5–15.5)
WBC: 11.5 10*3/uL — ABNORMAL HIGH (ref 4.0–10.5)
nRBC: 0 % (ref 0.0–0.2)

## 2024-04-08 LAB — URINE DRUG SCREEN, QUALITATIVE (ARMC ONLY)
Amphetamines, Ur Screen: NOT DETECTED
Barbiturates, Ur Screen: NOT DETECTED
Benzodiazepine, Ur Scrn: NOT DETECTED
Cannabinoid 50 Ng, Ur ~~LOC~~: NOT DETECTED
Cocaine Metabolite,Ur ~~LOC~~: NOT DETECTED
MDMA (Ecstasy)Ur Screen: NOT DETECTED
Methadone Scn, Ur: NOT DETECTED
Opiate, Ur Screen: NOT DETECTED
Phencyclidine (PCP) Ur S: NOT DETECTED
Tricyclic, Ur Screen: POSITIVE — AB

## 2024-04-08 LAB — PHOSPHORUS: Phosphorus: 2.1 mg/dL — ABNORMAL LOW (ref 2.5–4.6)

## 2024-04-08 LAB — COMPREHENSIVE METABOLIC PANEL WITH GFR
ALT: 16 U/L (ref 0–44)
AST: 22 U/L (ref 15–41)
Albumin: 3.9 g/dL (ref 3.5–5.0)
Alkaline Phosphatase: 128 U/L — ABNORMAL HIGH (ref 38–126)
Anion gap: 13 (ref 5–15)
BUN: 12 mg/dL (ref 8–23)
CO2: 22 mmol/L (ref 22–32)
Calcium: 9.2 mg/dL (ref 8.9–10.3)
Chloride: 98 mmol/L (ref 98–111)
Creatinine, Ser: 0.74 mg/dL (ref 0.44–1.00)
GFR, Estimated: >60 mL/min (ref >60)
Glucose, Bld: 269 mg/dL — ABNORMAL HIGH (ref 70–99)
Potassium: 3.7 mmol/L (ref 3.5–5.1)
Sodium: 133 mmol/L — ABNORMAL LOW (ref 135–145)
Total Bilirubin: 0.5 mg/dL (ref 0.0–1.2)
Total Protein: 7.3 g/dL (ref 6.5–8.1)

## 2024-04-08 LAB — BETA-HYDROXYBUTYRIC ACID
Beta-Hydroxybutyric Acid: 0.87 mmol/L — ABNORMAL HIGH (ref 0.05–0.27)
Beta-Hydroxybutyric Acid: 0.61 mmol/L — ABNORMAL HIGH (ref 0.05–0.27)

## 2024-04-08 LAB — MRSA NEXT GEN BY PCR, NASAL: MRSA by PCR Next Gen: NOT DETECTED

## 2024-04-08 LAB — PROCALCITONIN: Procalcitonin: <0.1 ng/mL

## 2024-04-08 LAB — LACTIC ACID, PLASMA
Lactic Acid, Venous: 2.8 mmol/L (ref 0.5–1.9)
Lactic Acid, Venous: 2.4 mmol/L (ref 0.5–1.9)

## 2024-04-08 LAB — HIV ANTIBODY (ROUTINE TESTING W REFLEX): HIV Screen 4th Generation wRfx: NONREACTIVE

## 2024-04-08 LAB — MAGNESIUM: Magnesium: 1.7 mg/dL (ref 1.7–2.4)

## 2024-04-08 MED ORDER — LORAZEPAM 2 MG/ML IJ SOLN
2.0000 mg | INTRAMUSCULAR | Status: DC | PRN
  Administered 2024-04-08: 2 mg via INTRAVENOUS
  Filled 2024-04-08: qty 1

## 2024-04-08 MED ORDER — SODIUM CHLORIDE 0.9 % IV SOLN
2.0000 g | Freq: Two times a day (BID) | INTRAVENOUS | Status: DC
  Administered 2024-04-08 – 2024-04-12 (×8): 2 g via INTRAVENOUS
  Filled 2024-04-08 (×9): qty 20

## 2024-04-08 MED ORDER — ONDANSETRON HCL 4 MG/2ML IJ SOLN: 4.0000 mg | Freq: Four times a day (QID) | INTRAMUSCULAR | Status: DC | PRN

## 2024-04-08 MED ORDER — HALOPERIDOL LACTATE 5 MG/ML IJ SOLN: 1.0000 mg | Freq: Four times a day (QID) | INTRAMUSCULAR | Status: DC | PRN

## 2024-04-08 MED ORDER — LORAZEPAM 2 MG/ML IJ SOLN
0.5000 mg | Freq: Once | INTRAMUSCULAR | Status: AC
  Administered 2024-04-08: 0.5 mg via INTRAVENOUS
  Filled 2024-04-08: qty 1

## 2024-04-08 MED ORDER — MORPHINE SULFATE (PF) 2 MG/ML IV SOLN
2.0000 mg | Freq: Once | INTRAVENOUS | Status: AC
  Administered 2024-04-08: 2 mg via INTRAVENOUS
  Filled 2024-04-08: qty 1

## 2024-04-08 MED ORDER — IPRATROPIUM-ALBUTEROL 0.5-2.5 (3) MG/3ML IN SOLN
3.0000 mL | Freq: Four times a day (QID) | RESPIRATORY_TRACT | Status: DC
  Administered 2024-04-08: 3 mL via RESPIRATORY_TRACT
  Filled 2024-04-08: qty 3

## 2024-04-08 MED ORDER — MAGNESIUM SULFATE 2 GM/50ML IV SOLN
2.0000 g | Freq: Once | INTRAVENOUS | Status: AC
  Administered 2024-04-08: 2 g via INTRAVENOUS
  Filled 2024-04-08: qty 50

## 2024-04-08 MED ORDER — CHLORHEXIDINE GLUCONATE CLOTH 2 % EX PADS
6.0000 | MEDICATED_PAD | Freq: Every day | CUTANEOUS | Status: DC
  Administered 2024-04-09: 6 via TOPICAL
  Filled 2024-04-08: qty 6

## 2024-04-08 MED ORDER — DEXTROSE 5 % IV SOLN
10.0000 mg/kg | Freq: Three times a day (TID) | INTRAVENOUS | Status: DC
  Administered 2024-04-08 – 2024-04-10 (×6): 750 mg via INTRAVENOUS
  Filled 2024-04-08 (×8): qty 15

## 2024-04-08 MED ORDER — OXYCODONE-ACETAMINOPHEN 5-325 MG PO TABS
2.0000 | ORAL_TABLET | Freq: Four times a day (QID) | ORAL | Status: DC | PRN
  Administered 2024-04-11 – 2024-04-12 (×3): 2 via ORAL
  Filled 2024-04-08 (×3): qty 2

## 2024-04-08 MED ORDER — LORAZEPAM 2 MG/ML PO CONC
0.5000 mg | Freq: Once | ORAL | Status: DC
Start: 2024-04-08 — End: 2024-04-08

## 2024-04-08 MED ORDER — INSULIN ASPART 100 UNIT/ML IJ SOLN
6.0000 [IU] | Freq: Three times a day (TID) | INTRAMUSCULAR | Status: DC
  Filled 2024-04-08: qty 1

## 2024-04-08 MED ORDER — SODIUM CHLORIDE 0.9 % IV SOLN
2.0000 g | Freq: Four times a day (QID) | INTRAVENOUS | Status: DC
  Administered 2024-04-08 – 2024-04-09 (×3): 2 g via INTRAVENOUS
  Filled 2024-04-08 (×5): qty 2000

## 2024-04-08 MED ORDER — DEXAMETHASONE SODIUM PHOSPHATE 10 MG/ML IJ SOLN
10.0000 mg | Freq: Four times a day (QID) | INTRAMUSCULAR | Status: DC
  Administered 2024-04-08 – 2024-04-10 (×9): 10 mg via INTRAVENOUS
  Filled 2024-04-08 (×10): qty 1

## 2024-04-08 MED ORDER — INSULIN ASPART 100 UNIT/ML IJ SOLN
0.0000 [IU] | Freq: Three times a day (TID) | INTRAMUSCULAR | Status: DC
  Administered 2024-04-08: 4 [IU] via SUBCUTANEOUS
  Administered 2024-04-08: 7 [IU] via SUBCUTANEOUS
  Filled 2024-04-08 (×2): qty 1

## 2024-04-08 MED ORDER — LABETALOL HCL 5 MG/ML IV SOLN
10.0000 mg | INTRAVENOUS | Status: DC | PRN
  Administered 2024-04-08 (×3): 10 mg via INTRAVENOUS
  Filled 2024-04-08 (×3): qty 4

## 2024-04-08 MED ORDER — LORAZEPAM 2 MG/ML IJ SOLN
0.5000 mg | Freq: Four times a day (QID) | INTRAMUSCULAR | Status: DC | PRN
  Administered 2024-04-08 – 2024-04-09 (×2): 0.5 mg via INTRAVENOUS
  Filled 2024-04-08 (×2): qty 1

## 2024-04-08 MED ORDER — ACETAMINOPHEN 10 MG/ML IV SOLN
1000.0000 mg | Freq: Once | INTRAVENOUS | Status: AC
  Administered 2024-04-08: 1000 mg via INTRAVENOUS
  Filled 2024-04-08: qty 100

## 2024-04-08 MED ORDER — LABETALOL HCL 5 MG/ML IV SOLN
10.0000 mg | INTRAVENOUS | Status: DC | PRN
  Administered 2024-04-08: 10 mg via INTRAVENOUS
  Filled 2024-04-08: qty 4

## 2024-04-08 MED ORDER — KETOROLAC TROMETHAMINE 30 MG/ML IJ SOLN
30.0000 mg | Freq: Once | INTRAMUSCULAR | Status: AC
  Administered 2024-04-08: 30 mg via INTRAVENOUS
  Filled 2024-04-08: qty 1

## 2024-04-08 MED ORDER — STROKE: EARLY STAGES OF RECOVERY BOOK: Freq: Once | Status: AC

## 2024-04-08 MED ORDER — ALBUTEROL SULFATE (2.5 MG/3ML) 0.083% IN NEBU: 2.5000 mg | INHALATION_SOLUTION | RESPIRATORY_TRACT | Status: DC | PRN

## 2024-04-08 MED ORDER — SODIUM PHOSPHATES 45 MMOLE/15ML IV SOLN
30.0000 mmol | Freq: Once | INTRAVENOUS | Status: AC
  Administered 2024-04-08: 30 mmol via INTRAVENOUS
  Filled 2024-04-08: qty 10

## 2024-04-08 MED ORDER — PANTOPRAZOLE SODIUM 40 MG PO TBEC: 40.0000 mg | DELAYED_RELEASE_TABLET | Freq: Every day | ORAL | Status: DC

## 2024-04-08 MED ORDER — ONDANSETRON HCL 4 MG/2ML IJ SOLN
4.0000 mg | Freq: Once | INTRAMUSCULAR | Status: AC
  Administered 2024-04-08: 4 mg via INTRAVENOUS
  Filled 2024-04-08: qty 2

## 2024-04-08 MED ORDER — POLYETHYLENE GLYCOL 3350 17 G PO PACK
17.0000 g | PACK | Freq: Every day | ORAL | Status: DC | PRN
  Administered 2024-04-11 – 2024-04-12 (×2): 17 g via ORAL
  Filled 2024-04-08 (×2): qty 1

## 2024-04-08 MED ORDER — INSULIN GLARGINE-YFGN 100 UNIT/ML ~~LOC~~ SOLN
20.0000 [IU] | Freq: Two times a day (BID) | SUBCUTANEOUS | Status: DC
  Administered 2024-04-08 – 2024-04-09 (×3): 20 [IU] via SUBCUTANEOUS
  Filled 2024-04-08 (×3): qty 0.2

## 2024-04-08 MED ORDER — VANCOMYCIN HCL 750 MG/150ML IV SOLN
750.0000 mg | Freq: Two times a day (BID) | INTRAVENOUS | Status: DC
  Filled 2024-04-08: qty 150

## 2024-04-08 MED ORDER — INSULIN ASPART 100 UNIT/ML IJ SOLN
0.0000 [IU] | Freq: Every day | INTRAMUSCULAR | Status: DC
  Administered 2024-04-08: 3 [IU] via SUBCUTANEOUS
  Filled 2024-04-08: qty 1

## 2024-04-08 MED ORDER — KETOROLAC TROMETHAMINE 15 MG/ML IJ SOLN
15.0000 mg | Freq: Three times a day (TID) | INTRAMUSCULAR | Status: DC | PRN
  Administered 2024-04-09 (×2): 15 mg via INTRAVENOUS
  Filled 2024-04-08: qty 1

## 2024-04-08 MED ORDER — VANCOMYCIN HCL 1500 MG/300ML IV SOLN
1500.0000 mg | Freq: Once | INTRAVENOUS | Status: AC
  Administered 2024-04-08: 1500 mg via INTRAVENOUS
  Filled 2024-04-08: qty 300

## 2024-04-08 MED ORDER — IOHEXOL 350 MG/ML SOLN
100.0000 mL | Freq: Once | INTRAVENOUS | Status: AC | PRN
  Administered 2024-04-08: 100 mL via INTRAVENOUS

## 2024-04-08 MED ORDER — DOCUSATE SODIUM 100 MG PO CAPS: 100.0000 mg | ORAL_CAPSULE | Freq: Two times a day (BID) | ORAL | Status: DC | PRN

## 2024-04-08 MED ORDER — ROSUVASTATIN CALCIUM 10 MG PO TABS
20.0000 mg | ORAL_TABLET | Freq: Every day | ORAL | Status: DC
  Administered 2024-04-10 – 2024-04-12 (×3): 20 mg via ORAL
  Filled 2024-04-08 (×3): qty 2

## 2024-04-08 MED ORDER — IPRATROPIUM-ALBUTEROL 0.5-2.5 (3) MG/3ML IN SOLN: 3.0000 mL | Freq: Four times a day (QID) | RESPIRATORY_TRACT | Status: DC | PRN

## 2024-04-08 MED ORDER — NICARDIPINE HCL IN NACL 20-0.86 MG/200ML-% IV SOLN
3.0000 mg/h | INTRAVENOUS | Status: DC
  Administered 2024-04-08: 5 mg/h via INTRAVENOUS
  Administered 2024-04-09: 2.5 mg/h via INTRAVENOUS
  Administered 2024-04-09 (×2): 5 mg/h via INTRAVENOUS
  Filled 2024-04-08 (×3): qty 200

## 2024-04-08 MED ORDER — VANCOMYCIN HCL 750 MG/150ML IV SOLN
750.0000 mg | Freq: Two times a day (BID) | INTRAVENOUS | Status: DC
  Administered 2024-04-09 – 2024-04-10 (×3): 750 mg via INTRAVENOUS
  Filled 2024-04-08 (×4): qty 150

## 2024-04-08 NOTE — Plan of Care (Signed)
  Problem: Clinical Measurements: Goal: Ability to maintain clinical measurements within normal limits will improve Outcome: Progressing Goal: Diagnostic test results will improve Outcome: Progressing Goal: Respiratory complications will improve Outcome: Progressing   Problem: Clinical Measurements: Goal: Ability to maintain clinical measurements within normal limits will improve Outcome: Progressing   Problem: Clinical Measurements: Goal: Diagnostic test results will improve Outcome: Progressing   Problem: Clinical Measurements: Goal: Respiratory complications will improve Outcome: Progressing

## 2024-04-08 NOTE — Progress Notes (Signed)
 SLP Cancellation Note  Patient Details Name: Samantha Burgess MRN: [id number] DOB: 1960/11/22   Cancelled treatment:       Reason Eval/Treat Not Completed: Patient at procedure or test/unavailable  Pt is off the floor in radiology. Will re-attmept.   Takira Sherrin B. Garlin Junker, M.S., CCC-SLP, CBIS Speech-Language Pathologist Certified Brain Injury Specialist [facility]  [facility] [phone] Ascom [phone] Fax [phone]  Samantha Burgess 04/08/2024, 9:21 AM

## 2024-04-08 NOTE — Consult Note (Addendum)
 [facility] Consult Services   Patient Name:   Samantha Burgess, Samantha Burgess Date of Birth:   1961-07-19 Identification Number:   MRN - [id number] Date of Service:   04/07/2024 23:43:24  Impression:      63 year old female with a past medical history of diabetes hypertension was brought in by family for a suspected seizure like activity. Patient was last well at about 9 PM according to family. She then became confused and had generalized tonic clonic shaking in both arms and legs with her eyes open and foaming at the mouth. EMS found her to be postictal and confused. Upon arrival to the ED, she continues to have left arm and leg weakness, confusion and daughter feels that she is not back to baseline. No further seizures.  Her blood sugar was 550s and she was started on IV fluids and insulin drip. Blood pressure initially was 190 systolic and she was started on Nicardipine.    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    Clinically provoked seizure in the setting of hyperglycemia.    Please obtain CTA h and neck to r/o LVO.  Continue management of DKA  MRI brain without contrast and stat EEG              Our recommendations are outlined below.  Recommendations:        Stroke/Telemetry Floor       Neuro Checks (Q2)       Bedside Swallow Eval       DVT Prophylaxis       IV Fluids, Normal Saline       Head of Bed 30 Degrees       Euglycemia and Avoid Hyperthermia (PRN Acetaminophen )       Initiate or continue Aspirin 81 MG daily       Antihypertensives PRN if Blood pressure is greater than 220/120 or there is a concern for End organ damage/contraindications for permissive HTN. If blood pressure is greater than 220/120 give labetalol PO or IV or Vasotec IV with a goal of 15% reduction in BP during the first 24 hours.  Sign Out:       Discussed with Emergency Department Provider       Discussed  with Rapid Response Team    ------------------------------------------------------------------------------  Metrics: Last Known Well: 04/07/2024 21:00:00 Dispatch Time: 04/07/2024 23:43:24 Arrival Time: 04/08/2024 00:20:33 Initial Response Time: 04/07/2024 23:54:12 Symptoms: seizur and confusion. Initial patient interaction: 04/08/2024 00:15:00 NIHSS Assessment Completed: 04/08/2024 00:26:19 Patient is not a candidate for Thrombolytic. Thrombolytic Medical Decision: 04/08/2024 00:26:20 Patient was not deemed candidate for Thrombolytic because of following reasons: Seizure at onset with postictal residual neurological impairments .  CT Head: Images were unavailable to me at the time of the exam. Radiologist was unavailable to review images. As per the radiologist report: no hemorrhage  Primary Provider Notified of Diagnostic Impression and Management Plan on: 04/08/2024 00:32:19    ------------------------------------------------------------------------------  History of Present Illness: Patient is a 63 year old Female.  Patient was brought by EMS for symptoms of seizur and confusion. 63 year old female with a past medical history of diabetes hypertension was brought in by family for a suspected seizure like activity. Patient was last well at about 9 PM according to [facility] that sign. She then was going to stop your confused and shaking in both arms and legs with her eyes open and foaming at the mouth. EMS found her to be postictal  and confused. Upon arrival she continues to have left arm and leg weakness, confusion and daughter feels that she is not back to baseline. Her blood sugar was 550s and she was started on IV fluids and insulin drip. Blood pressure initially was 190 20 systolic and she was started on the card up in drip   Past Medical History:      Hypertension      Diabetes Mellitus      There is no history of Seizures  Medications:  No Anticoagulant use  No  Antiplatelet use Reviewed EMR for current medications  Allergies:  Reviewed  Social History: Drug Use: No  Family History:  There is no family history of premature cerebrovascular disease pertinent to this consultation  ROS : 14 Points Review of Systems was performed and was negative except mentioned in HPI.  Past Surgical History: There Is No Surgical History Contributory To Today's Visit    Examination: BP(176/98), Pulse(85), Blood Glucose(551) 1A: Level of Consciousness - Alert; keenly responsive + 0 1B: Ask Month and Age - Both Questions Right + 0 1C: Blink Eyes & Squeeze Hands - Performs Both Tasks + 0 2: Test Horizontal Extraocular Movements - Partial Gaze Palsy: Can Be Overcome + 1 3: Test Visual Fields - No Visual Loss + 0 4: Test Facial Palsy (Use Grimace if Obtunded) - Minor paralysis (flat nasolabial fold, smile asymmetry) + 1 5A: Test Left Arm Motor Drift - Drift, but doesn't hit bed + 1 5B: Test Right Arm Motor Drift - No Drift for 10 Seconds + 0 6A: Test Left Leg Motor Drift - Drift, but doesn't hit bed + 1 6B: Test Right Leg Motor Drift - No Drift for 5 Seconds + 0 7: Test Limb Ataxia (FNF/Heel-Shin) - No Ataxia + 0 8: Test Sensation - Normal; No sensory loss + 0 9: Test Language/Aphasia - Normal; No aphasia + 0 10: Test Dysarthria - Normal + 0 11: Test Extinction/Inattention - No abnormality + 0  NIHSS Score: 4   Pre-Morbid Modified Rankin Scale: 1 Points = No significant disability despite symptoms; able to carry out all usual duties and activities  Spoke with : Dr Vallery Gavel  This consult was conducted in real time using interactive audio and Immunologist. Patient was informed of the technology being used for this visit and agreed to proceed. Patient located in hospital and provider located at home/office setting.   Patient is being evaluated for possible acute neurologic impairment and high probability of imminent or life-threatening deterioration. I  spent total of 60 minutes providing care to this patient, including time for face to face visit via telemedicine, review of medical records, imaging studies and discussion of findings with providers, the patient and/or family.   Dr Audrie Leatherwood   [facility] For Inpatient follow-up with [facility] physician please call [facility] at [phone]. As we are not an outpatient service for any post hospital discharge needs please contact the hospital for assistance. If you have any questions for the [facility] physicians or need to reconsult for clinical or diagnostic changes please contact us  via [facility] at [phone].

## 2024-04-08 NOTE — Progress Notes (Signed)
 Eeg done

## 2024-04-08 NOTE — Consult Note (Signed)
 NAME:  Samantha Burgess, MRN:  [id number], DOB:  January 11, 1961, LOS: 0 ADMISSION DATE:  04/07/2024, CONSULTATION DATE:  04/08/24 REFERRING MD:  Dr. Andy Bannister, CHIEF COMPLAINT:  Seizure, AMS   Brief Pt Description / Synopsis:  63 y.o. female admitted with Acute Metabolic Encephalopathy in the setting of Seizure due to [facility], concern for Todd Paralysis.  Ruling out meningitis.  History of Present Illness:  Samantha Burgess is a 63 y.o. 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 She presented to [facility] ED on 04/07/24  due to witnessed tonic clonic seizure at home.   Patient is currently altered and unable to contribute to history, and no family currently available, therefore history is obtained from chart review.  Per EMS patient was post ictal on arrival. In the field patient CBG was 127. LKW 9pm.Patient transported to ED for further care. Per family patient did not have any bowel or bladder incontinence and had LOC for around 30 sec per chart. Patient currently s/p ativan given for agitation. Patient not able to give history at this time and history is taken from chart as patient is sedated.   On evaluation in ED patient was found thave mild left facial droop and left arm and leg weakness, and confusion .  At that time code stroke was called. CTH was noted to be negative as well as CTA and CT perfusion. Patient was presumed to have Todd's paralysis however CVA could not be ruled out. Patient was then slated for MRI as well as EEG for further work up.  Further evaluation patient was found to have blood sugar of 710 for which she was started on endotool for diagnosis of HHS. Patient also noted to have elevated blood pressure with systolic in  190's.  ED Course: Initial Vital Signs: Temperature 98.2 F orally, pulse 110, respiratory rate 20, blood pressure  164/92, SpO2 96% on room air Significant Labs: Sodium 128, chloride 91, glucose 710, BUN 18, creatinine 1.18, beta hydroxybutyrate acid 0.87 VBG: pH 7.35/pCO2 50/pO2 40/bicarb 27.6 Urinalysis negative for UTI, with 20 ketones UDS positive for tricyclics, ethyl alcohol less than 15 Imaging CT Head w/o contrast (code stroke)>>IMPRESSION: 1. 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 2. No acute intracranial abnormality. 3. The right lens is not clearly identified. Correlation with the patient's surgical history is recommended CTa Head & Neck/Perfusion scan>>IMPRESSION: 1. Negative CTA of the head and neck. No large vessel occlusion or other emergent finding. No hemodynamically significant or correctable stenosis. 2. Negative CT perfusion with no evidence for acute ischemia or other perfusion abnormality Medications Administered: IV fluids, Ativan, Insulin gtt, Nicardipine gtt  Hospitalists asked to admit for further workup and treatment.  Please see "Significant Hospital Events" section below for full detailed hospital course.   Pertinent  Medical History   Past Medical History:  Diagnosis Date   Arthritis 2000   DM (diabetes mellitus) (HCC) 2005   GERD (gastroesophageal reflux disease)    HLD (hyperlipidemia)    HTN (hypertension) 2007   Obesity 1999   Sleep apnea 2007    Micro Data:  5/21: MRSA PCR>> negative 5/21: Blood cultures x2>>  Antimicrobials:   Anti-infectives (From admission, onward)    Start     Dose/Rate Route Frequency Ordered Stop   04/08/24 2200  cefTRIAXone (ROCEPHIN) 2 g in sodium chloride  0.9 %  100 mL IVPB        2 g 200 mL/hr over 30 Minutes Intravenous Every 12 hours 04/08/24 1525     04/08/24 1800  ampicillin  (OMNIPEN) 2 g in sodium chloride  0.9 % 100 mL IVPB        2 g 300 mL/hr over 20 Minutes Intravenous Every 6 hours 04/08/24 1525     04/08/24 1700  vancomycin (VANCOREADY) IVPB 750 mg/150 mL         750 mg 150 mL/hr over 60 Minutes Intravenous Every 12 hours 04/08/24 1533          Significant Hospital Events: Including procedures, antibiotic start and stop dates in addition to other pertinent events   5/20: Presented to ED with tonic clonic seizure.  Code stroke initiated, evaluated by [facility].  CT Head and CTA Heack and Neck/Perfusion scan all negative.   5/21: Admitted by [facility].  MRI brain negative for acute infarct, EEG pending.  PCCM consulted, becoming febrile (T max 102.6), start empiric menigitis coverage, consult IR for LP.  Interim History / Subjective:  As outlined above under "Significant Hospital Events" section  Objective    Blood pressure (!) 178/106, pulse (!) 105, temperature (!) 102.6 F (39.2 C), temperature source Oral, resp. rate (!) 26, height 5\' 3"  (1.6 m), weight 75.1 kg, last menstrual period 03/30/2013, SpO2 98%.        Intake/Output Summary (Last 24 hours) at 04/08/2024 1358 Last data filed at 04/08/2024 1216 Gross per 24 hour  Intake 663.36 ml  Output 1150 ml  Net -486.64 ml   Filed Weights   04/07/24 2204 04/08/24 0330  Weight: 106.6 kg 75.1 kg    Examination: General: Acutely ill-appearing female, laying in bed, with mild tachypnea, no acute distress HENT: Atraumatic, normocephalic, neck supple, no JVD Lungs: Clear breath sounds throughout, even, mild tachypnea Cardiovascular: Tachycardia, regular rhythm, s1s2, no M/R/G Abdomen: Limited abdominal exam given AMS: Soft, nontender, nondistended, no guarding or rebound tenderness, BS + x4 Extremities: Normal bulk and tone, no deformities, no edema Neuro: Lethargic, withdraws from painful stimuli and moans GU: Deferred  Resolved Hospital Problem list     Assessment & Plan:   #Acute Metabolic Encephalopathy #New onset Seizure, suspect due to marked Hyperglycemia #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 #Rule out meningitis  -CT Head, CTa Head and Neck, CT perfusion all negative -MRI Brain  negative for acute infarct -Treatment of metabolic derangements as outlined below -Seizure precautions -Prn Benzo's for recurrent seizures -Provide supportive care -Promote normal sleep/wake cycle and family presence -Avoid sedating medications as able -EEG pending -Neurology following, appreciate input ~ does not recommend AED at this time -ABX as above -PT/OT/Speech evaluations when mental status permits -Currently protecting her airway  #Hyperosmolar Hyperglycemia Stage -Follow HHS Protocol -Aggressive IV fluids -Already converted off Insulin gtt to Long acting insulin (Semglee 20 unit BID) + SSI -Diabetes Coordinator consulted, appreciate input -Check Hgb A1c  #Fever #Mild Leukocytosis #Concern for possible Meningitis -Monitor fever curve -Trend WBC's & Procalcitonin -Follow cultures as above -Start empiric Ampicillin , Acyclovir, Ceftriaxone, and Vancomycin pending cultures & sensitivities -Start Decadron prior to/with antibiotics -Consult IR for LP  #Hypertension PMHx: HLD -Continuous cardiac monitoring -Maintain MAP >65 -IV fluids -Allow for permissive HTN  x48 hrs from sx onset per Neuro recs ~ goal BP <220/110 (15% reduction in 1st 24 hrs) -Prn Labetalol & Nicardipine gtt if needed   #Pseudo Hyponatremia in setting of marked Hyperglycemia #Acute Kidney Injury -Monitor I&O's / urinary output -Follow BMP -  Ensure adequate renal perfusion -Avoid nephrotoxic agents as able -Replace electrolytes as indicated ~ Pharmacy following for assistance with electrolyte replacement    Best Practice (right click and "Reselect all SmartList Selections" daily)   Diet/type: NPO DVT prophylaxis: SCD GI prophylaxis: PPI Lines: N/A Foley:  N/A Code Status:  full code Last date of multidisciplinary goals of care discussion [N/A]   Labs   CBC: Recent Labs  Lab 04/07/24 2208 04/08/24 0836  WBC 6.8 11.5*  NEUTROABS 3.7  --   HGB 13.1 13.2  HCT 39.9 40.1  MCV 85.4  85.0  PLT 392 402*    Basic Metabolic Panel: Recent Labs  Lab 04/07/24 2208 04/08/24 0828  NA 128* 133*  K 3.7 3.7  CL 91* 98  CO2 22 22  GLUCOSE 710* 269*  BUN 18 12  CREATININE 1.18* 0.74  CALCIUM 9.3 9.2  MG  --  1.7  PHOS  --  2.1*   GFR: Estimated Creatinine Clearance: 70.8 mL/min (by C-G formula based on SCr of 0.74 mg/dL). Recent Labs  Lab 04/07/24 2208 04/08/24 0828 04/08/24 0836 04/08/24 1056  WBC 6.8  --  11.5*  --   LATICACIDVEN  --  2.8*  --  2.4*    Liver Function Tests: Recent Labs  Lab 04/08/24 0828  AST 22  ALT 16  ALKPHOS 128*  BILITOT 0.5  PROT 7.3  ALBUMIN 3.9   No results for input(s): "LIPASE", "AMYLASE" in the last 168 hours. No results for input(s): "AMMONIA" in the last 168 hours.  ABG    Component Value Date/Time   HCO3 31.1 (H) 04/08/2024 1332   O2SAT 38.9 04/08/2024 1332     Coagulation Profile: Recent Labs  Lab 04/07/24 2208  INR 1.0    Cardiac Enzymes: No results for input(s): "CKTOTAL", "CKMB", "CKMBINDEX", "TROPONINI" in the last 168 hours.  HbA1C: No results found for: "HGBA1C"  CBG: Recent Labs  Lab 04/08/24 0746 04/08/24 0857 04/08/24 0949 04/08/24 1041 04/08/24 1126  GLUCAP 239* 242* 245* 242* 238*    Review of Systems:   Unable to assess due to AMS   Past Medical History:  She,  has a past medical history of Arthritis (2000), DM (diabetes mellitus) (HCC) (2005), GERD (gastroesophageal reflux disease), HLD (hyperlipidemia), HTN (hypertension) (2007), Obesity (1999), and Sleep apnea (2007).   Surgical History:   Past Surgical History:  Procedure Laterality Date   CESAREAN SECTION     IR FLUORO GUIDE CV LINE RIGHT  05/10/2017   KNEE ARTHROSCOPY Right    UPPER GASTROINTESTINAL ENDOSCOPY       Social History:   reports that she has never smoked. She has never used smokeless tobacco. She reports that she does not drink alcohol and does not use drugs.   Family History:  Her family history  includes Diabetes in her maternal grandmother and mother; Esophageal cancer in her paternal grandmother. There is no history of Colon cancer, Rectal cancer, or Stomach cancer.   Allergies No Known Allergies   Home Medications  Prior to Admission medications   Medication Sig Start Date End Date Taking? Authorizing Provider  albuterol  (VENTOLIN  HFA) 108 (90 Base) MCG/ACT inhaler Inhale 2 puffs into the lungs every 4 (four) hours as needed for shortness of breath. 01/28/24   Menshew, Raye Cai, PA-C  amitriptyline (ELAVIL) 10 MG tablet Take 40-50 mg by mouth at bedtime.    [provider]  benzonatate  (TESSALON  PERLES) 100 MG capsule Take 1-2 tabs TID prn cough 01/28/24  Menshew, Raye Cai, PA-C  fluticasone  (FLONASE ) 50 MCG/ACT nasal spray Place 1 spray into both nostrils daily. 01/28/24   Menshew, Raye Cai, PA-C  hydrochlorothiazide (HYDRODIURIL) 25 MG tablet Take 25 mg by mouth daily.    [provider]  insulin aspart protamine- aspart (NOVOLOG MIX 70/30) (70-30) 100 UNIT/ML injection Inject into the skin.    [provider]  insulin detemir (LEVEMIR) 100 UNIT/ML injection Inject 30 Units into the skin at bedtime.    [provider]  insulin lispro (HUMALOG) 100 UNIT/ML injection Inject 50 Units into the skin 2 (two) times daily.    [provider]  oxyCODONE-acetaminophen  (PERCOCET) 10-325 MG per tablet Take 1 tablet by mouth 4 (four) times daily as needed for pain.    [provider]  ramipril (ALTACE) 10 MG capsule Take 10 mg by mouth daily.    [provider]  simvastatin (ZOCOR) 40 MG tablet Take 40 mg by mouth daily.    [provider]  sitaGLIPtan-metformin (JANUMET) 50-1000 MG per tablet Take 1 tablet by mouth 2 (two) times daily with a meal.    [provider]     Critical care time: 50 minutes     Cherylann Corpus, AGACNP-BC [facility] Prefer epic messenger for  cross cover needs If after hours, please call E-link

## 2024-04-08 NOTE — Consult Note (Addendum)
 Pharmacy Antibiotic Note  Samantha Burgess is a 63 y.o. female admitted on 04/07/2024 with seizures. While in ICU, patient spiked fever of unknown origin. Pharmacy has been consulted for Vancomycin and Acyclovir dosing for possible meningitis. Patient will also be receiving Ceftriaxone 2g Q12 hours and Ampicillin  2g Q6 hours  Plan: Vancomycin 1500 units IV x 1 as loading dose, followed by: Vancomycin 750 mg IV Q 12 hrs. Goal AUC 400-550. Expected AUC: 509.4 Expected Cmin: 15.9 SCr used: 0.8(actual 0.74), Vd used: 0.72  Acyclovir 750mg (~10mg /kg) Q8 hours   Height: 5\' 3"  (160 cm) Weight: 75.1 kg (165 lb 9.1 oz) IBW/kg (Calculated) : 52.4  Temp (24hrs), Avg:100 F (37.8 C), Min:97.7 F (36.5 C), Max:102.6 F (39.2 C)  Recent Labs  Lab 04/07/24 2208 04/08/24 0828 04/08/24 0836 04/08/24 1056  WBC 6.8  --  11.5*  --   CREATININE 1.18* 0.74  --   --   LATICACIDVEN  --  2.8*  --  2.4*    Estimated Creatinine Clearance: 70.8 mL/min (by C-G formula based on SCr of 0.74 mg/dL).    No Known Allergies  Antimicrobials this admission: Vancomycin 5/21 >>  Acyclovir 5/21 >> Ceftriaxone 5/21 >>  Ampicillin  5/21 >>  Dose adjustments this admission: N/A  Microbiology results: 5/21 BCx: ordered 5/21 MRSA PCR: negative  Thank you for allowing pharmacy to be a part of this patient's care.  Samantha Burgess A Samantha Burgess 04/08/2024 3:33 PM

## 2024-04-08 NOTE — Progress Notes (Signed)
 MD notified patient's RR sustaining in 20s-30s. Satting > 95% on RA. No new orders.

## 2024-04-08 NOTE — Plan of Care (Signed)
  Problem: Education: Goal: Knowledge of disease or condition will improve Outcome: Not Progressing Goal: Knowledge of secondary prevention will improve (MUST DOCUMENT ALL) Outcome: Not Progressing Goal: Knowledge of patient specific risk factors will improve (DELETE if not current risk factor) Outcome: Not Progressing   Problem: Ischemic Stroke/TIA Tissue Perfusion: Goal: Complications of ischemic stroke/TIA will be minimized Outcome: Not Progressing

## 2024-04-08 NOTE — Progress Notes (Signed)
 PT Cancellation Note  Patient Details Name: Archana Eckman MRN: [id number] DOB: 1961/11/13   Cancelled Treatment:    Reason Eval/Treat Not Completed: Fatigue/lethargy limiting ability to participate (Consult received and chart reviewed.  Patient lethargic (s/p ativan for seizure-like activity) and unable to arouse/participate at this time.  Will continue to follow and initiate as medically appropriate.)  Blake Goya H. Bevin Bucks, PT, DPT, NCS 04/08/24, 10:59 AM [phone]

## 2024-04-08 NOTE — Evaluation (Signed)
 Speech Language Pathology Evaluation Patient Details Name: Samantha Burgess MRN: [id number] DOB: 01/21/1961 Today's Date: 04/08/2024 Time: 0272-5366 SLP Time Calculation (min) (ACUTE ONLY): 10 min  Problem List:  Patient Active Problem List   Diagnosis Date Noted   Seizure (HCC) 04/08/2024   Dyspepsia and other specified disorders of function of stomach 03/18/2013   Dysphagia 03/18/2013   Past Medical History:  Past Medical History:  Diagnosis Date   Arthritis 2000   DM (diabetes mellitus) (HCC) 2005   GERD (gastroesophageal reflux disease)    HLD (hyperlipidemia)    HTN (hypertension) 2007   Obesity 1999   Sleep apnea 2007   Past Surgical History:  Past Surgical History:  Procedure Laterality Date   CESAREAN SECTION     IR FLUORO GUIDE CV LINE RIGHT  05/10/2017   KNEE ARTHROSCOPY Right    UPPER GASTROINTESTINAL ENDOSCOPY     HPI:  Samantha Burgess is a 63 y.o. 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 Patient presents to  s/ witnessed tonic clonic seizure at home. Per EMS patient was post ictal on arrival. In the field patient CBG was 127. LKW 9pm.Patient transported to ED for further care. Per family patient did not have any bowel or bladder incontinence and had LOC for around 30 sec per chart.   Assessment / Plan / Recommendation Clinical Impression  Pt had recently returned from MRI. Nursing in with pt. This Clinical research associate provided maximal multimodal assistance to promote arousal. Despite attempts at repositioning pt in bed, heavy sternal rub and calling her name, pt was not able to demonstrate arousal except for some moaning in response to her name. Per nursing , pt has not received any sedating meds "but likely wore herself out from fighting some much this morning." Difficult to evaluate cognitive function but chart review  reveals that pt is currently prescribed a regular diet with thin liquids. Given the above, would recommend pt be strictly NPO with medicine provided via alternative means. Information communicated to pt's treatment team, order placed for bedside swallow evaluation and order placed for NPO. ST services to continue to follow.    SLP Assessment  SLP Recommendation/Assessment: Patient needs continued Speech Lanaguage Pathology Services SLP Visit Diagnosis: Cognitive communication deficit (R41.841)    Recommendations for follow up therapy are one component of a multi-disciplinary discharge planning process, led by the attending physician.  Recommendations may be updated based on patient status, additional functional criteria and insurance authorization.    Follow Up Recommendations  Follow physician's recommendations for discharge plan and follow up therapies    Assistance Recommended at Discharge   TBD  Functional Status Assessment Patient has had a recent decline in their functional status and/or demonstrates limited ability to make significant improvements in function in a reasonable and predictable amount of time  Frequency and Duration min 2x/week  2 weeks      SLP Evaluation Cognition  Overall Cognitive Status: Difficult to assess Arousal/Alertness:  (decreased arousal suspect d/t post-itical state) Orientation Level: Disoriented to person;Disoriented to place;Disoriented to time;Disoriented to situation                Samantha Burgess B. Samantha Burgess, M.S., CCC-SLP, CBIS Speech-Language Pathologist Certified Brain Injury Specialist [facility]  [facility] Rehabilitation Services Office [phone] Ascom [phone] Fax [phone]         Samantha Burgess Samantha Burgess  04/08/2024, 11:24 AM

## 2024-04-08 NOTE — Progress Notes (Signed)
 OT Cancellation Note  Patient Details Name: Samantha Burgess MRN: [id number] DOB: Jan 16, 1961   Cancelled Treatment:    Reason Eval/Treat Not Completed: Fatigue/lethargy limiting ability to participate.Patient lethargic (s/p ativan for seizure-like activity) and unable to arouse/participate at this time. Will continue to follow and initiate as medically appropriate.)   George Kinder, MS, OTR/L , CBIS ascom [phone]  04/08/24, 11:13 AM

## 2024-04-08 NOTE — Progress Notes (Signed)
 Pt trying to get out of bed and repeating that she "needs to pee." Patient unable to be reoriented. Disoriented x 4. Bladder scan volume showed greater than 600 mL. Obtained order from provider for foley placement.

## 2024-04-08 NOTE — Inpatient Diabetes Management (Addendum)
 Inpatient Diabetes Program Recommendations  AACE/ADA: New Consensus Statement on Inpatient Glycemic Control (2015)  Target Ranges:  Prepandial:   less than 140 mg/dL      Peak postprandial:   less than 180 mg/dL (1-2 hours)      Critically ill patients:  140 - 180 mg/dL   Lab Results  Component Value Date   GLUCAP 245 (H) 04/08/2024    Review of Glycemic Control  Latest Reference Range & Units 04/08/24 03:23 04/08/24 04:32 04/08/24 05:32 04/08/24 06:33 04/08/24 07:46 04/08/24 08:57 04/08/24 09:49  Glucose-Capillary 70 - 99 mg/dL 782 (H) 956 (H) 213 (H) 267 (H) 239 (H) 242 (H) 245 (H)   Diabetes history: DM 2 Outpatient Diabetes medications:  Levemir 30 units daily Humalog 50 units bid (?75/25) Current orders for Inpatient glycemic control:  Semglee 20 units bid  Novolog 6 units tid with meals Novolog 0-20 units tid with meals and HS  Inpatient Diabetes Program Recommendations:   Transitioning off insulin drip.  Agree with current orders.    Thanks,  Josefa Ni, RN, BC-ADM Inpatient Diabetes Coordinator Pager [phone]  (8a-5p)

## 2024-04-08 NOTE — H&P (Addendum)
 History and Physical    Samantha Burgess MVH:[id number] DOB: [birth year] DOA: 04/07/2024  PCP: Ladonna Pickup, MD  Patient coming from: home  I have personally briefly reviewed patient's old medical records in [facility]  Chief Complaint: seizure  HPI: Samantha Burgess is a 63 y.o. 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 Patient presents to  s/ witnessed tonic clonic seizure at home. Per EMS patient was post ictal on arrival. In the field patient CBG was 127. LKW 9pm.Patient transported to ED for further care. Per family patient did not have any bowel or bladder incontinence and had LOC for around 30 sec per chart. Patient currently s/p ativan given for agitation. Patient not able to give history at this time and history is taken from chart as patient is sedated.  ED Course:  In ED  Vitals:  On evaluation in ED patient was found thave mild left facial droop and left arm and leg weakness, and confusion .  At that time code stroke was called. CTH was noted to be negative as well as CTA and CT perfusion. Patient was presumed to have Todd's paralysis however CVA could not be ruled out. Patient was then slated for MRI as well as EEG for further work u   ON further evaluation patient was found to have blood sugar of 710 for which she was started on endotool for diagnosis of HHS. Patient also noted to have elevated blood pressure with systolic in  190's   CTA head and neck  IMPRESSION: 1. Negative CTA of the head and neck. No large vessel occlusion or other emergent finding. No hemodynamically significant or correctable stenosis. 2. Negative CT perfusion with no evidence for acute ischemia or other perfusion abnormality.   CTH IMPRESSION: 1. Generalized cerebral atrophy with chronic white matter small vessel ischemic  changes. 2. No acute intracranial abnormality. 3. The right lens is not clearly identified. Correlation with the patient's surgical history is recommended.   EKG:nsr, LAE, nonspecific st-t changes in anterior lead Labs:wbc 6.8, hgb 13.1,plt 392 Na 129, 3.7, glu 710, cr 1.18 (0.7) ETOH<15  CE14 VBG: 7.35/50 UDS: + TCA UA -no noted infection   Tx nicardipine drip, endotool,Potassium, LR,ativan Review of Systems: As per HPI otherwise 10 point review of systems negative.   Past Medical History:  Diagnosis Date   Arthritis 2000   DM (diabetes mellitus) (HCC) 2005   GERD (gastroesophageal reflux disease)    HLD (hyperlipidemia)    HTN (hypertension) 2007   Obesity 1999   Sleep apnea 2007    Past Surgical History:  Procedure Laterality Date   CESAREAN SECTION     IR FLUORO GUIDE CV LINE RIGHT  05/10/2017   KNEE ARTHROSCOPY Right    UPPER GASTROINTESTINAL ENDOSCOPY       reports that she has never smoked. She has never used smokeless tobacco. She reports that she does not drink alcohol and does not use drugs.  No Known Allergies  Family History  Problem Relation Age of Onset   Esophageal cancer Paternal Grandmother    Diabetes Mother    Diabetes Maternal Grandmother    Colon cancer Neg Hx    Rectal cancer Neg Hx    Stomach cancer Neg Hx     Prior to Admission medications   Medication Sig Start Date End Date Taking? Authorizing Provider  albuterol  (VENTOLIN  HFA) 108 (90 Base) MCG/ACT inhaler Inhale 2 puffs into the lungs every 4 (four) hours as needed for shortness of breath. 01/28/24   Menshew, Raye Cai, PA-C  amitriptyline (ELAVIL) 10 MG tablet Take 40-50 mg by mouth at bedtime.    [provider]  benzonatate  (TESSALON  PERLES) 100 MG capsule Take 1-2 tabs TID prn cough 01/28/24   Menshew, Jenise V Bacon, PA-C  fluticasone  (FLONASE ) 50 MCG/ACT nasal spray Place 1 spray into both nostrils daily. 01/28/24   Menshew, Raye Cai, PA-C  hydrochlorothiazide  (HYDRODIURIL) 25 MG tablet Take 25 mg by mouth daily.    [provider]  insulin aspart protamine- aspart (NOVOLOG MIX 70/30) (70-30) 100 UNIT/ML injection Inject into the skin.    [provider]  insulin detemir (LEVEMIR) 100 UNIT/ML injection Inject 30 Units into the skin at bedtime.    [provider]  insulin lispro (HUMALOG) 100 UNIT/ML injection Inject 50 Units into the skin 2 (two) times daily.    [provider]  oxyCODONE-acetaminophen  (PERCOCET) 10-325 MG per tablet Take 1 tablet by mouth 4 (four) times daily as needed for pain.    [provider]  ramipril (ALTACE) 10 MG capsule Take 10 mg by mouth daily.    [provider]  simvastatin (ZOCOR) 40 MG tablet Take 40 mg by mouth daily.    [provider]  sitaGLIPtan-metformin (JANUMET) 50-1000 MG per tablet Take 1 tablet by mouth 2 (two) times daily with a meal.    [provider]    Physical Exam: Vitals:   04/08/24 0330 04/08/24 0400 04/08/24 0500 04/08/24 0600  BP:  (!) 165/91 (!) 166/89 (!) 169/82  Pulse:  (!) 114 (!) 119 (!) 118  Resp:  (!) 24 20 (!) 23  Temp:      TempSrc:      SpO2:  97% 100% 98%  Weight: 75.1 kg     Height: 5\' 3"  (1.6 m)       Constitutional: NAD, calm, comfortable sedated Vitals:   04/08/24 0330 04/08/24 0400 04/08/24 0500 04/08/24 0600  BP:  (!) 165/91 (!) 166/89 (!) 169/82  Pulse:  (!) 114 (!) 119 (!) 118  Resp:  (!) 24 20 (!) 23  Temp:      TempSrc:      SpO2:  97% 100% 98%  Weight: 75.1 kg     Height: 5\' 3"  (1.6 m)      Eyes: left reactive , right not reactive( recent surgery and hx of detached retina), lids and conjunctivae normal , conjunctiva slightly injected on right. ENMT: Mucous membranes are dry . Posterior pharnyx unable to be evaluated based on patient cooperation Respiratory: clear to auscultation bilaterally anteriorly, no wheezing, no crackles. Normal respiratory effort. No accessory muscle use.   Cardiovascular: Regular rate and rhythm, no murmurs / rubs / gallops. No extremity edema. 2+ pedal pulses. No carotid bruits.  Abdomen: no tenderness, no masses palpated. No hepatosplenomegaly. Bowel sounds positive.  Musculoskeletal: no clubbing / cyanosis. No joint deformity upper and lower extremities. Good ROM, no contractures. Normal muscle tone.  Skin: no rashes, lesions, ulcers. No induration Neurologic: CN 2-12 grossly intact. Sensation intact, Strength MAE x 4 , but noted some rigidity on left upper extremity.  Psychiatric: sedated unable to assess  Labs on Admission: I have personally reviewed following labs and imaging studies  CBC: Recent Labs  Lab 04/07/24 2208  WBC 6.8  NEUTROABS 3.7  HGB 13.1  HCT  39.9  MCV 85.4  PLT 392   Basic Metabolic Panel: Recent Labs  Lab 04/07/24 2208  NA 128*  K 3.7  CL 91*  CO2 22  GLUCOSE 710*  BUN 18  CREATININE 1.18*  CALCIUM 9.3   GFR: Estimated Creatinine Clearance: 48 mL/min (A) (by C-G formula based on SCr of 1.18 mg/dL (H)). Liver Function Tests: No results for input(s): "AST", "ALT", "ALKPHOS", "BILITOT", "PROT", "ALBUMIN" in the last 168 hours. No results for input(s): "LIPASE", "AMYLASE" in the last 168 hours. No results for input(s): "AMMONIA" in the last 168 hours. Coagulation Profile: Recent Labs  Lab 04/07/24 2208  INR 1.0   Cardiac Enzymes: No results for input(s): "CKTOTAL", "CKMB", "CKMBINDEX", "TROPONINI" in the last 168 hours. BNP (last 3 results) No results for input(s): "PROBNP" in the last 8760 hours. HbA1C: No results for input(s): "HGBA1C" in the last 72 hours. CBG: Recent Labs  Lab 04/08/24 0302 04/08/24 0323 04/08/24 0432 04/08/24 0532 04/08/24 0633  GLUCAP 212* 225* 270* 312* 267*   Lipid Profile: No results for input(s): "CHOL", "HDL", "LDLCALC", "TRIG", "CHOLHDL", "LDLDIRECT" in the last 72 hours. Thyroid Function Tests: No results for input(s): "TSH", "T4TOTAL", "FREET4",  "T3FREE", "THYROIDAB" in the last 72 hours. Anemia Panel: No results for input(s): "VITAMINB12", "FOLATE", "FERRITIN", "TIBC", "IRON", "RETICCTPCT" in the last 72 hours. Urine analysis:    Component Value Date/Time   COLORURINE STRAW (A) 04/07/2024 2208   APPEARANCEUR HAZY (A) 04/07/2024 2208   LABSPEC 1.029 04/07/2024 2208   PHURINE 5.0 04/07/2024 2208   GLUCOSEU >=500 (A) 04/07/2024 2208   HGBUR NEGATIVE 04/07/2024 2208   BILIRUBINUR NEGATIVE 04/07/2024 2208   KETONESUR NEGATIVE 04/07/2024 2208   PROTEINUR 30 (A) 04/07/2024 2208   NITRITE NEGATIVE 04/07/2024 2208   LEUKOCYTESUR NEGATIVE 04/07/2024 2208    Radiological Exams on Admission: CT Angio Head Neck W WO CM (CODE STROKE) Result Date: 04/08/2024 CLINICAL DATA:  Initial evaluation for acute neuro deficit, stroke. EXAM: CT ANGIOGRAPHY HEAD AND NECK CT PERFUSION BRAIN TECHNIQUE: Multidetector CT imaging of the head and neck was performed using the standard protocol during bolus administration of intravenous contrast. Multiplanar CT image reconstructions and MIPs were obtained to evaluate the vascular anatomy. Carotid stenosis measurements (when applicable) are obtained utilizing NASCET criteria, using the distal internal carotid diameter as the denominator. Multiphase CT imaging of the brain was performed following IV bolus contrast injection. Subsequent parametric perfusion maps were calculated using RAPID software. RADIATION DOSE REDUCTION: This exam was performed according to the departmental dose-optimization program which includes automated exposure control, adjustment of the mA and/or kV according to patient size and/or use of iterative reconstruction technique. CONTRAST:  100mL OMNIPAQUE IOHEXOL 350 MG/ML SOLN COMPARISON:  Prior CT from 04/07/2024. FINDINGS: CTA NECK FINDINGS Aortic arch: Standard branching. Imaged portion shows no evidence of aneurysm or dissection. No significant stenosis of the major arch vessel origins. Right  carotid system: No evidence of dissection, stenosis (50% or greater) or occlusion. Left carotid system: No evidence of dissection, stenosis (50% or greater) or occlusion. Vertebral arteries: Codominant. No evidence of dissection, stenosis (50% or greater) or occlusion. Skeleton: No worrisome osseous lesions. Other neck: No other acute finding. Upper chest: No other acute finding. Review of the MIP images confirms the above findings CTA HEAD FINDINGS Anterior circulation: Both internal carotid arteries widely patent to the termini without stenosis. A1 segments widely patent. Normal anterior communicating artery complex. Both anterior cerebral arteries widely patent to their distal aspects without stenosis. No M1 stenosis  or occlusion. Normal MCA bifurcations. Distal MCA branches well perfused and symmetric. Posterior circulation: Both V4 segments patent without stenosis. Both PICA patent at their origins. 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 Basilar patent without stenosis. Superior cerebellar and posterior cerebral arteries patent bilaterally. Venous sinuses: Patent allowing for timing the contrast bolus. Anatomic variants: None significant.  No aneurysm. Review of the MIP images confirms the above findings CT Brain Perfusion Findings: CBF (<30%) Volume: 0mL Perfusion (Tmax>6.0s) volume: 4mL Mismatch Volume: 4mL Infarction Location:Negative CT perfusion with no evidence for acute core infarct. 4 mL area of delayed perfusion overlies the skull base, consistent with artifact. No other perfusion abnormality. IMPRESSION: 1. Negative CTA of the head and neck. No large vessel occlusion or other emergent finding. No hemodynamically significant or correctable stenosis. 2. Negative CT perfusion with no evidence for acute ischemia or other perfusion abnormality. Electronically Signed   By: Virgia Griffins M.D.   On: 04/08/2024 01:20   CT CEREBRAL PERFUSION W CONTRAST Result Date:  04/08/2024 CLINICAL DATA:  Initial evaluation for acute neuro deficit, stroke. EXAM: CT ANGIOGRAPHY HEAD AND NECK CT PERFUSION BRAIN TECHNIQUE: Multidetector CT imaging of the head and neck was performed using the standard protocol during bolus administration of intravenous contrast. Multiplanar CT image reconstructions and MIPs were obtained to evaluate the vascular anatomy. Carotid stenosis measurements (when applicable) are obtained utilizing NASCET criteria, using the distal internal carotid diameter as the denominator. Multiphase CT imaging of the brain was performed following IV bolus contrast injection. Subsequent parametric perfusion maps were calculated using RAPID software. RADIATION DOSE REDUCTION: This exam was performed according to the departmental dose-optimization program which includes automated exposure control, adjustment of the mA and/or kV according to patient size and/or use of iterative reconstruction technique. CONTRAST:  100mL OMNIPAQUE IOHEXOL 350 MG/ML SOLN COMPARISON:  Prior CT from 04/07/2024. FINDINGS: CTA NECK FINDINGS Aortic arch: Standard branching. Imaged portion shows no evidence of aneurysm or dissection. No significant stenosis of the major arch vessel origins. Right carotid system: No evidence of dissection, stenosis (50% or greater) or occlusion. Left carotid system: No evidence of dissection, stenosis (50% or greater) or occlusion. Vertebral arteries: Codominant. No evidence of dissection, stenosis (50% or greater) or occlusion. Skeleton: No worrisome osseous lesions. Other neck: No other acute finding. Upper chest: No other acute finding. Review of the MIP images confirms the above findings CTA HEAD FINDINGS Anterior circulation: Both internal carotid arteries widely patent to the termini without stenosis. A1 segments widely patent. Normal anterior communicating artery complex. Both anterior cerebral arteries widely patent to their distal aspects without stenosis. No M1  stenosis or occlusion. Normal MCA bifurcations. Distal MCA branches well perfused and symmetric. Posterior circulation: Both V4 segments patent without stenosis. Both PICA patent at their origins. 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 Basilar patent without stenosis. Superior cerebellar and posterior cerebral arteries patent bilaterally. Venous sinuses: Patent allowing for timing the contrast bolus. Anatomic variants: None significant.  No aneurysm. Review of the MIP images confirms the above findings CT Brain Perfusion Findings: CBF (<30%) Volume: 0mL Perfusion (Tmax>6.0s) volume: 4mL Mismatch Volume: 4mL Infarction Location:Negative CT perfusion with no evidence for acute core infarct. 4 mL area of delayed perfusion overlies the skull base, consistent with artifact. No other perfusion abnormality. IMPRESSION: 1. Negative CTA of the head and neck. No large vessel occlusion or other emergent finding. No hemodynamically significant or correctable stenosis. 2. Negative CT perfusion with no evidence for acute  ischemia or other perfusion abnormality. Electronically Signed   By: Virgia Griffins M.D.   On: 04/08/2024 01:20   CT Head Wo Contrast Result Date: 04/07/2024 CLINICAL DATA:  New onset seizure. EXAM: CT HEAD WITHOUT CONTRAST TECHNIQUE: Contiguous axial images were obtained from the base of the skull through the vertex without intravenous contrast. RADIATION DOSE REDUCTION: This exam was performed according to the departmental dose-optimization program which includes automated exposure control, adjustment of the mA and/or kV according to patient size and/or use of iterative reconstruction technique. COMPARISON:  None Available. FINDINGS: Brain: There is mild cerebral atrophy with widening of the extra-axial spaces and ventricular dilatation. There are areas of decreased attenuation within the white matter tracts of the supratentorial brain, consistent with microvascular  disease changes. Vascular: No hyperdense vessel or unexpected calcification. Skull: Normal. Negative for fracture or focal lesion. Sinuses/Orbits: The right lens is not clearly identified. Other: None. IMPRESSION: 1. 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 2. No acute intracranial abnormality. 3. The right lens is not clearly identified. Correlation with the patient's surgical history is recommended. Electronically Signed   By: Virgle Grime M.D.   On: 04/07/2024 22:31    EKG: Independently reviewed. See above  Assessment/Plan  New Onset Seziure with ? Of Todd's paralysis r/o CVA /TIA -left sided weakness  -CTH, CTA  neck and head,CT perfusion negative  -admit tia/cva r/o protocol to ICU -MRI brain pending /EEG -A1c/HLD pending -Permissive HTN x48 hours from sx onset or until stroke ruled out by MRI -goal BP < 220/110, PRN above these parameters - per neuro ASA 81mg  daily -neuro checks , SLP, PT/OT  -no episode of recurrent seizure, neurology does not recommend AED at this time  - place on seizure precautions -ensure electrolytes are stable  -await final neuro recs     Hyperosmolar Non-Ketotic state  -place on endo tool with good response  -continue endo tool per protocol  -will expect to transition soon, ordered written  - A1c pending   Psuedo hyponatremia  -monitor labs   Acute Encephalopathy - multifactorial, metabolic derangements, medications, possible post ictal state vs tia   HLD -continue statin , lipid panel pending   Uncontrolled HTN  - initially in 190's was placed on nicardipine drip  -bp now in the 160's systolic  - will d/c drip and continue permissive hypertension as per neuro  rec  OSA -cpap as able   Discussed case with Critical care Dr Jaclynn Mast, currently agree with plan  No new interventions recommended at this time but states to re-consult if patient declines.   DVT prophylaxis: scd Code Status:  full Family Communication:  none at bedside Disposition Plan: patient  expected to be admitted greater than 2 midnights  Consults called:neuro Admission status: SDU   Sabas Cradle MD [facility]   If 7PM-7AM, please contact night-coverage [email or web address] Password [email or web address]  04/08/2024, 7:33 AM

## 2024-04-08 NOTE — Progress Notes (Signed)
 2338 - stroke alert activated for this patient who came in via EMS for seziure like activity. now c/o left sided facial droop, left arm weakness, and slurred speech. NCCT head already done prior to activation.   0454 - [facility] page sent.   2356 - [facility] Irshad on the cart. Report given. NCCT Head read given.   0004 - [facility] started NIHSS.   0016 - [facility] to call and make recommendations to EDP. [facility] ordering advanced imaging but wants to get patients BP and BGL under control prior to going for scan. [facility] will monitor for imaging and relay results to [facility]. [facility] off cart.   Elanur.Dyers - bedside nurse states they are taking patient for advanced imaging now.   0200 - CTA/CTP read sent to [facility] Irshad via secure messaging.

## 2024-04-08 NOTE — Progress Notes (Addendum)
 While SLP evaluating patient at bedside, patient became difficult to arouse and tensing up R. Arm. PRN Ativan given for seizure activity. MD notified and called to assess pt at bedside. No new orders at this time.

## 2024-04-09 ENCOUNTER — Inpatient Hospital Stay

## 2024-04-09 ENCOUNTER — Inpatient Hospital Stay: Admitting: Radiology

## 2024-04-09 DIAGNOSIS — R569 Unspecified convulsions: Secondary | ICD-10-CM | POA: Diagnosis not present

## 2024-04-09 DIAGNOSIS — E669 Obesity, unspecified: Secondary | ICD-10-CM | POA: Insufficient documentation

## 2024-04-09 HISTORY — PX: IR LUMBAR PUNCTURE: IMG944

## 2024-04-09 LAB — CSF CELL COUNT WITH DIFFERENTIAL
Eosinophils, CSF: 0 % (ref 0–1)
Eosinophils, CSF: 0 % (ref 0–1)
Lymphs, CSF: 4 % — ABNORMAL LOW (ref 40–80)
Lymphs, CSF: 1 % — ABNORMAL LOW (ref 40–80)
Monocyte-Macrophage-Spinal Fluid: 1 % — ABNORMAL LOW (ref 15–45)
Monocyte-Macrophage-Spinal Fluid: 2 % — ABNORMAL LOW (ref 15–45)
Other Cells, CSF: 1
RBC Count, CSF: 11056 /mm3 — ABNORMAL HIGH
RBC Count, CSF: 2164 /mm3 — ABNORMAL HIGH
Segmented Neutrophils-CSF: 93 % — ABNORMAL HIGH (ref 0–6)
Segmented Neutrophils-CSF: 97 % — ABNORMAL HIGH (ref 0–6)
Tube #: 1
Tube #: 4
WBC, CSF: 365 /mm3 (ref 0–5)
WBC, CSF: 283 /mm3 (ref 0–5)

## 2024-04-09 LAB — MENINGITIS/ENCEPHALITIS PANEL (CSF)

## 2024-04-09 LAB — BASIC METABOLIC PANEL WITH GFR
Anion gap: 12 (ref 5–15)
BUN: 9 mg/dL (ref 8–23)
CO2: 22 mmol/L (ref 22–32)
Calcium: 9 mg/dL (ref 8.9–10.3)
Chloride: 99 mmol/L (ref 98–111)
Creatinine, Ser: 0.76 mg/dL (ref 0.44–1.00)
GFR, Estimated: >60 mL/min (ref >60)
Glucose, Bld: 294 mg/dL — ABNORMAL HIGH (ref 70–99)
Potassium: 3.9 mmol/L (ref 3.5–5.1)
Sodium: 133 mmol/L — ABNORMAL LOW (ref 135–145)

## 2024-04-09 LAB — GLUCOSE, CAPILLARY
Glucose-Capillary: 319 mg/dL — ABNORMAL HIGH (ref 70–99)
Glucose-Capillary: 308 mg/dL — ABNORMAL HIGH (ref 70–99)
Glucose-Capillary: 311 mg/dL — ABNORMAL HIGH (ref 70–99)
Glucose-Capillary: 272 mg/dL — ABNORMAL HIGH (ref 70–99)
Glucose-Capillary: 337 mg/dL — ABNORMAL HIGH (ref 70–99)
Glucose-Capillary: 185 mg/dL — ABNORMAL HIGH (ref 70–99)
Glucose-Capillary: 90 mg/dL (ref 70–99)

## 2024-04-09 LAB — ECHOCARDIOGRAM COMPLETE
Area-P 1/2: 5.66 cm2
Height: 63 in
S' Lateral: 3.2 cm
Weight: 2649.05 [oz_av]

## 2024-04-09 LAB — LACTIC ACID, PLASMA
Lactic Acid, Venous: 1.3 mmol/L (ref 0.5–1.9)
Lactic Acid, Venous: 1.5 mmol/L (ref 0.5–1.9)

## 2024-04-09 LAB — CBC
HCT: 43.8 % (ref 36.0–46.0)
Hemoglobin: 14.8 g/dL (ref 12.0–15.0)
MCH: 28.5 pg (ref 26.0–34.0)
MCHC: 33.8 g/dL (ref 30.0–36.0)
MCV: 84.2 fL (ref 80.0–100.0)
Platelets: 401 10*3/uL — ABNORMAL HIGH (ref 150–400)
RBC: 5.2 MIL/uL — ABNORMAL HIGH (ref 3.87–5.11)
RDW: 13 % (ref 11.5–15.5)
WBC: 11.5 10*3/uL — ABNORMAL HIGH (ref 4.0–10.5)
nRBC: 0 % (ref 0.0–0.2)

## 2024-04-09 LAB — PROTEIN AND GLUCOSE, CSF
Glucose, CSF: 184 mg/dL — ABNORMAL HIGH (ref 40–70)
Total  Protein, CSF: 136 mg/dL — ABNORMAL HIGH (ref 15–45)

## 2024-04-09 LAB — HEMOGLOBIN A1C
Hgb A1c MFr Bld: >15.5 % — ABNORMAL HIGH (ref 4.8–5.6)
Mean Plasma Glucose: >398 mg/dL

## 2024-04-09 LAB — PHOSPHORUS: Phosphorus: 2.8 mg/dL (ref 2.5–4.6)

## 2024-04-09 LAB — MAGNESIUM: Magnesium: 2 mg/dL (ref 1.7–2.4)

## 2024-04-09 MED ORDER — LIDOCAINE HCL (PF) 1 % IJ SOLN
5.0000 mL | Freq: Once | INTRAMUSCULAR | Status: AC
  Administered 2024-04-09: 5 mL
  Filled 2024-04-09: qty 5

## 2024-04-09 MED ORDER — SODIUM CHLORIDE 0.9 % IV SOLN
2.0000 g | INTRAVENOUS | Status: DC
  Administered 2024-04-09 – 2024-04-10 (×7): 2 g via INTRAVENOUS
  Filled 2024-04-09 (×9): qty 2000

## 2024-04-09 MED ORDER — INSULIN ASPART 100 UNIT/ML IJ SOLN
0.0000 [IU] | INTRAMUSCULAR | Status: DC
  Administered 2024-04-09: 15 [IU] via SUBCUTANEOUS
  Administered 2024-04-09: 4 [IU] via SUBCUTANEOUS
  Administered 2024-04-09 (×2): 15 [IU] via SUBCUTANEOUS
  Administered 2024-04-09 – 2024-04-10 (×2): 3 [IU] via SUBCUTANEOUS
  Filled 2024-04-09 (×6): qty 1

## 2024-04-09 MED ORDER — INSULIN GLARGINE-YFGN 100 UNIT/ML ~~LOC~~ SOLN
25.0000 [IU] | Freq: Two times a day (BID) | SUBCUTANEOUS | Status: DC
Start: 2024-04-09 — End: 2024-04-10
  Administered 2024-04-09: 25 [IU] via SUBCUTANEOUS
  Filled 2024-04-09 (×2): qty 0.25

## 2024-04-09 MED ORDER — LORAZEPAM 2 MG/ML IJ SOLN
2.0000 mg | Freq: Once | INTRAMUSCULAR | Status: AC | PRN
  Administered 2024-04-09: 2 mg via INTRAVENOUS
  Filled 2024-04-09: qty 1

## 2024-04-09 MED ORDER — KETOROLAC TROMETHAMINE 30 MG/ML IJ SOLN
INTRAMUSCULAR | Status: AC
Start: 2024-04-09 — End: ?
  Filled 2024-04-09: qty 1

## 2024-04-09 MED ORDER — INSULIN GLARGINE-YFGN 100 UNIT/ML ~~LOC~~ SOLN
22.0000 [IU] | Freq: Two times a day (BID) | SUBCUTANEOUS | Status: DC
  Filled 2024-04-09: qty 0.22

## 2024-04-09 MED ORDER — LIDOCAINE HCL (PF) 1 % IJ SOLN
INTRAMUSCULAR | Status: AC
  Filled 2024-04-09: qty 30

## 2024-04-09 MED ORDER — LIDOCAINE HCL (PF) 1 % IJ SOLN
10.0000 mL | Freq: Once | INTRAMUSCULAR | Status: AC
  Administered 2024-04-09: 5 mL
  Filled 2024-04-09: qty 10

## 2024-04-09 MED ORDER — PANTOPRAZOLE SODIUM 40 MG IV SOLR
40.0000 mg | INTRAVENOUS | Status: DC
  Administered 2024-04-09 – 2024-04-10 (×2): 40 mg via INTRAVENOUS
  Filled 2024-04-09 (×2): qty 10

## 2024-04-09 MED ORDER — FENTANYL CITRATE (PF) 100 MCG/2ML IJ SOLN
INTRAMUSCULAR | Status: AC
  Filled 2024-04-09: qty 2

## 2024-04-09 MED ORDER — FENTANYL CITRATE (PF) 100 MCG/2ML IJ SOLN
INTRAMUSCULAR | Status: AC | PRN
  Administered 2024-04-09 (×2): 25 ug via INTRAVENOUS

## 2024-04-09 MED ORDER — HALOPERIDOL LACTATE 5 MG/ML IJ SOLN: 5.0000 mg | Freq: Once | INTRAMUSCULAR | Status: DC | PRN

## 2024-04-09 MED ORDER — MIDAZOLAM HCL 2 MG/2ML IJ SOLN
INTRAMUSCULAR | Status: AC | PRN
  Administered 2024-04-09 (×2): .5 mg via INTRAVENOUS

## 2024-04-09 MED ORDER — ACETAMINOPHEN 10 MG/ML IV SOLN: 1000.0000 mg | Freq: Four times a day (QID) | INTRAVENOUS | Status: AC | PRN

## 2024-04-09 MED ORDER — LEVETIRACETAM (KEPPRA) 500 MG/5 ML ADULT IV PUSH
500.0000 mg | Freq: Two times a day (BID) | INTRAVENOUS | Status: DC
  Administered 2024-04-10 – 2024-04-11 (×4): 500 mg via INTRAVENOUS
  Filled 2024-04-09 (×5): qty 5

## 2024-04-09 MED ORDER — MIDAZOLAM HCL 2 MG/2ML IJ SOLN
INTRAMUSCULAR | Status: AC
  Filled 2024-04-09: qty 2

## 2024-04-09 MED ORDER — POTASSIUM CHLORIDE IN NACL 20-0.9 MEQ/L-% IV SOLN
INTRAVENOUS | Status: AC
  Filled 2024-04-09 (×4): qty 1000

## 2024-04-09 MED ORDER — INSULIN ASPART 100 UNIT/ML IJ SOLN
4.0000 [IU] | INTRAMUSCULAR | Status: DC
  Administered 2024-04-09: 4 [IU] via SUBCUTANEOUS
  Filled 2024-04-09: qty 1

## 2024-04-09 MED ORDER — LEVETIRACETAM (KEPPRA) 500 MG/5 ML ADULT IV PUSH
20.0000 mg/kg | Freq: Once | INTRAVENOUS | Status: AC
  Administered 2024-04-09: 1500 mg via INTRAVENOUS
  Filled 2024-04-09: qty 15

## 2024-04-09 NOTE — Evaluation (Signed)
 Occupational Therapy Evaluation Patient Details Name: Samantha Burgess MRN: [id number] DOB: 04-11-1961 Today's Date: 04/09/2024   History of Present Illness   Samantha Burgess is a 63 y.o. 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 Patient presents to  s/ witnessed tonic clonic seizure at home. Per EMS patient was post ictal on arrival. In the field patient CBG was 127.     Clinical Impressions Patient presenting with decreased Ind in self care,balance,functional mobility/transfers, endurance, and safety awareness. Patient follows some commands with increased time but unable to answer any orientation questions other than her name. No family present to discuss PLOF with. Patient currently functioning at +2 assistance for supine <>sit. Pt with eyes closed and fatigues quickily. She is tremulous all over and returned to supine and covered up per her request.  Patient will benefit from acute OT to increase overall independence in the areas of ADLs, functional mobility, and safety awareness in order to safely discharge.     If plan is discharge home, recommend the following:   A lot of help with walking and/or transfers;A lot of help with bathing/dressing/bathroom;Assistance with cooking/housework;Assist for transportation;Help with stairs or ramp for entrance     Functional Status Assessment   Patient has had a recent decline in their functional status and demonstrates the ability to make significant improvements in function in a reasonable and predictable amount of time.     Equipment Recommendations   Other (comment) (defer to next venue of care)      Precautions/Restrictions   Precautions Precautions: Fall Recall of Precautions/Restrictions: Impaired     Mobility Bed Mobility Overal bed mobility: Needs  Assistance Bed Mobility: Sit to Supine, Supine to Sit     Supine to sit: Total assist, +2 for physical assistance Sit to supine: Total assist, +2 for physical assistance        Transfers                   General transfer comment: not attempted      Balance Overall balance assessment: Needs assistance Sitting-balance support: Feet supported Sitting balance-Leahy Scale: Poor                                     ADL either performed or assessed with clinical judgement   ADL                                         General ADL Comments: total A at this time     Vision Patient Visual Report: No change from baseline              Pertinent Vitals/Pain Pain Assessment Pain Assessment: PAINAD Breathing: normal Negative Vocalization: none Facial Expression: smiling or inexpressive Body Language: relaxed Consolability: no need to console PAINAD Score: 0 Facial Expression: Relaxed, neutral Body Movements: Absence of movements Muscle Tension: Relaxed     Extremity/Trunk Assessment Upper Extremity Assessment Upper Extremity Assessment: Generalized weakness   Lower Extremity Assessment Lower Extremity Assessment: Generalized weakness   Cervical / Trunk Assessment Cervical / Trunk Assessment: Normal   Communication Communication Communication: Impaired Factors Affecting Communication: Difficulty expressing self   Cognition Arousal: Lethargic Behavior  During Therapy: WFL for tasks assessed/performed                                 Following commands: Impaired Following commands impaired: Follows one step commands inconsistently     Cueing  General Comments   Cueing Techniques: Verbal cues;Gestural cues;Tactile cues              Home Living Family/patient expects to be discharged to:: Private residence Living Arrangements: Children                               Additional Comments:  patient unable to provide history due to LOC          OT Problem List: Decreased strength;Decreased activity tolerance;Decreased knowledge of use of DME or AE;Impaired balance (sitting and/or standing);Decreased knowledge of precautions;Decreased safety awareness   OT Treatment/Interventions: Self-care/ADL training;Therapeutic exercise;Cognitive remediation/compensation;Therapeutic activities;Energy conservation;DME and/or AE instruction;Balance training;Patient/family education      OT Goals(Current goals can be found in the care plan section)   Acute Rehab OT Goals Patient Stated Goal: to get warm OT Goal Formulation: With patient Time For Goal Achievement: 04/23/24 Potential to Achieve Goals: Fair ADL Goals Pt Will Perform Grooming: with min assist Pt Will Perform Lower Body Dressing: with min assist Pt Will Transfer to Toilet: with min assist Pt Will Perform Toileting - Clothing Manipulation and hygiene: with min assist   OT Frequency:  Min 2X/week    Co-evaluation PT/OT/SLP Co-Evaluation/Treatment: Yes Reason for Co-Treatment: Complexity of the patient's impairments (multi-system involvement);Necessary to address cognition/behavior during functional activity;To address functional/ADL transfers;For patient/therapist safety PT goals addressed during session: Mobility/safety with mobility OT goals addressed during session: ADL's and self-care      AM-PAC OT "6 Clicks" Daily Activity     Outcome Measure Help from another person eating meals?: A Lot Help from another person taking care of personal grooming?: A Lot Help from another person toileting, which includes using toliet, bedpan, or urinal?: Total Help from another person bathing (including washing, rinsing, drying)?: Total Help from another person to put on and taking off regular upper body clothing?: Total Help from another person to put on and taking off regular lower body clothing?: Total 6 Click Score: 8   End  of Session Nurse Communication: Mobility status  Activity Tolerance: Patient limited by fatigue Patient left: in bed;with call bell/phone within reach;with bed alarm set  OT Visit Diagnosis: Unsteadiness on feet (R26.81);Repeated falls (R29.6);Muscle weakness (generalized) (M62.81)                Time: 2841-3244 OT Time Calculation (min): 10 min Charges:  OT General Charges $OT Visit: 1 Visit OT Evaluation $OT Eval Moderate Complexity: [address], [place], OTR/L , [facility] ascom [phone]  04/09/24, 12:33 PM

## 2024-04-09 NOTE — Procedures (Addendum)
 PROCEDURE SUMMARY:  Unsuccessful fluoroscopic guided lumbar puncture. Patient remained too agitated despite PRN ativan given at time of procedure.   L3-L4 and L4-L5 accessed, with return of CSF on L4-L5. However, patient remained too agitated to proceed with safe collection. No immediate complications.  Patient tolerated poorly.  EBL = none  Please see full dictation in imaging section of Epic for procedure details.

## 2024-04-09 NOTE — Consult Note (Signed)
 PHARMACY CONSULT NOTE - ELECTROLYTES  Pharmacy Consult for Electrolyte Monitoring and Replacement   Recent Labs: Height: 5\' 3"  (160 cm) Weight: 80.4 kg (177 lb 4 oz) IBW/kg (Calculated) : 52.4 Estimated Creatinine Clearance: 73.2 mL/min (by C-G formula based on SCr of 0.76 mg/dL). Potassium (mmol/L)  Date Value  04/09/2024 3.9   Magnesium (mg/dL)  Date Value  86/57/8469 2.0   Calcium (mg/dL)  Date Value  62/95/2841 9.0   Albumin (g/dL)  Date Value  32/44/0102 3.9   Phosphorus (mg/dL)  Date Value  72/53/6644 2.8   Sodium (mmol/L)  Date Value  04/09/2024 133 (L)    Assessment  Samantha Burgess is a 63 y.o. female presenting with seizures. PMH significant for DMII, HLD, HTN , obesity, OSA. Pharmacy has been consulted to monitor and replace electrolytes.  Diet: NPO MIVF: NS+56mEqK @ 125 mL/hr Pertinent medications: N/A  Goal of Therapy: Electrolytes WNL  Plan:  No replacement currently indicated Check BMP, Mg, Phos with AM labs  Thank you for allowing pharmacy to be a part of this patient's care.  Renia Mikelson A Derrick Tiegs, PharmD Clinical Pharmacist 04/09/2024 1:26 PM

## 2024-04-09 NOTE — Plan of Care (Signed)
  Problem: Clinical Measurements: Goal: Diagnostic test results will improve Outcome: Progressing Goal: Cardiovascular complication will be avoided Outcome: Progressing   Problem: Activity: Goal: Risk for activity intolerance will decrease Outcome: Progressing   Problem: Elimination: Goal: Will not experience complications related to urinary retention Outcome: Progressing   Problem: Pain Managment: Goal: General experience of comfort will improve and/or be controlled Outcome: Progressing   Problem: Safety: Goal: Ability to remain free from injury will improve Outcome: Progressing   Problem: Skin Integrity: Goal: Risk for impaired skin integrity will decrease Outcome: Progressing

## 2024-04-09 NOTE — Plan of Care (Signed)
 Neurology brief note  Attempted to see patient in f/u today but both times she was undergoing IR LP. D/w Dr. Sari Cunning, I agree with current management and plan. Will see patient in f/u tmrw AM.  Greg Leaks, MD [facility] [phone]  If 7pm- 7am, please page neurology on call as listed in AMION.

## 2024-04-09 NOTE — Progress Notes (Signed)
 PROGRESS NOTE    Samantha Burgess  VWU:[id number] DOB: Dec 31, 1960 DOA: 04/07/2024 PCP: Ladonna Pickup, MD  Outpatient Specialists: none    Brief Narrative:   From admission h and p  Samantha Burgess is a 63 y.o. 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 Patient presents to  s/ witnessed tonic clonic seizure at home. Per EMS patient was post ictal on arrival. In the field patient CBG was 127. LKW 9pm.Patient transported to ED for further care. Per family patient did not have any bowel or bladder incontinence and had LOC for around 30 sec per chart. Patient currently s/p ativan  given for agitation. Patient not able to give history at this time and history is taken from chart as patient is sedated.    Assessment & Plan:   Principal Problem:   Seizure (HCC) Active Problems:   Essential hypertension   Type 2 diabetes mellitus with stage 2 chronic kidney disease, with long-term current use of insulin  (HCC)   Obesity (BMI 30-39.9)  # HHS # T2DM Improved but remains hyperglycemic - add fluids - increase semglee  to 22 bid - SSI q4  # Seizure EEG done. Remains altered. MRI motion degraded but negative, CTA negative. - neurology to see today - eeg results pending - anti-convulsant per neuro  # Fever Concern for meningitis. Respiratory panels negative. Cxr clear - monitor blood cultures, ngtd - LP ordered - on empiric acyclofir, ceftriaxone , vancomycin , ampicillin , dexamethasone   # Hypertensive urgency/emergency MRI nothing acute - no nicardipine  gtt  # SIRS Fever, tachycardia, leukocytosis.  - abx and fluids as above  # Obesity noted   DVT prophylaxis: lovenox  held pending LP Code Status: full Family Communication: daughter Read Camel updated telephonically 5/22  Level of care: Stepdown Status is:  Inpatient Remains inpatient appropriate because: severity of illness    Consultants:  Pccm  Procedures: LP pending  Antimicrobials:  See above    Subjective: altered  Objective: Vitals:   04/09/24 0600 04/09/24 0615 04/09/24 0630 04/09/24 0645  BP: (!) 179/114 (!) 158/94 (!) 170/92 (!) 159/87  Pulse: (!) 121 (!) 124 (!) 120 (!) 117  Resp: (!) 24 19 (!) 40 (!) 28  Temp:      TempSrc:      SpO2: 97% 98% 100% 100%  Weight:      Height:        Intake/Output Summary (Last 24 hours) at 04/09/2024 0839 Last data filed at 04/09/2024 0647 Gross per 24 hour  Intake 2542.3 ml  Output 2100 ml  Net 442.3 ml   Filed Weights   04/07/24 2204 04/08/24 0330 04/09/24 0500  Weight: 106.6 kg 75.1 kg 80.4 kg    Examination:  General exam: Appears ill Respiratory system: Clear to auscultation. Respiratory effort normal. Cardiovascular system: S1 & S2 heard, tachycardic Gastrointestinal system: Abdomen is obese, soft and nontender.   Central nervous system: moving all 4, doesn't follow commands Extremities: warm Skin: No rashes, lesions or ulcers Psychiatry: altered     Data Reviewed: I have personally reviewed following labs and imaging studies  CBC: Recent Labs  Lab 04/07/24 2208 04/08/24 0836 04/09/24 0315  WBC 6.8 11.5* 11.5*  NEUTROABS 3.7  --   --   HGB 13.1 13.2 14.8  HCT 39.9 40.1 43.8  MCV 85.4 85.0 84.2  PLT 392 402* 401*   Basic Metabolic Panel: Recent  Labs  Lab 04/07/24 2208 04/08/24 0828 04/09/24 0315  NA 128* 133* 133*  K 3.7 3.7 3.9  CL 91* 98 99  CO2 22 22 22   GLUCOSE 710* 269* 294*  BUN 18 12 9   CREATININE 1.18* 0.74 0.76  CALCIUM 9.3 9.2 9.0  MG  --  1.7 2.0  PHOS  --  2.1* 2.8   GFR: Estimated Creatinine Clearance: 73.2 mL/min (by C-G formula based on SCr of 0.76 mg/dL). Liver Function Tests: Recent Labs  Lab 04/08/24 0828  AST 22  ALT 16  ALKPHOS 128*  BILITOT 0.5  PROT 7.3  ALBUMIN 3.9   No results for input(s): "LIPASE",  "AMYLASE" in the last 168 hours. No results for input(s): "AMMONIA" in the last 168 hours. Coagulation Profile: Recent Labs  Lab 04/07/24 2208  INR 1.0   Cardiac Enzymes: No results for input(s): "CKTOTAL", "CKMB", "CKMBINDEX", "TROPONINI" in the last 168 hours. BNP (last 3 results) No results for input(s): "PROBNP" in the last 8760 hours. HbA1C: No results for input(s): "HGBA1C" in the last 72 hours. CBG: Recent Labs  Lab 04/08/24 1929 04/08/24 2126 04/08/24 2322 04/09/24 0410 04/09/24 0745  GLUCAP 216* 266* 342* 319* 308*   Lipid Profile: Recent Labs    04/08/24 0828  CHOL 203*  HDL 55  LDLCALC 112*  TRIG 178*  CHOLHDL 3.7   Thyroid Function Tests: No results for input(s): "TSH", "T4TOTAL", "FREET4", "T3FREE", "THYROIDAB" in the last 72 hours. Anemia Panel: No results for input(s): "VITAMINB12", "FOLATE", "FERRITIN", "TIBC", "IRON", "RETICCTPCT" in the last 72 hours. Urine analysis:    Component Value Date/Time   COLORURINE STRAW (A) 04/08/2024 1258   APPEARANCEUR CLEAR (A) 04/08/2024 1258   LABSPEC 1.025 04/08/2024 1258   PHURINE 7.0 04/08/2024 1258   GLUCOSEU >=500 (A) 04/08/2024 1258   HGBUR NEGATIVE 04/08/2024 1258   BILIRUBINUR NEGATIVE 04/08/2024 1258   KETONESUR 20 (A) 04/08/2024 1258   PROTEINUR >=300 (A) 04/08/2024 1258   NITRITE NEGATIVE 04/08/2024 1258   LEUKOCYTESUR NEGATIVE 04/08/2024 1258   Sepsis Labs: @LABRCNTIP (procalcitonin:4,lacticidven:4)  ) Recent Results (from the past 240 hours)  MRSA Next Gen by PCR, Nasal     Status: None   Collection Time: 04/08/24  3:10 AM   Specimen: Nasal Mucosa; Nasal Swab  Result Value Ref Range Status   MRSA by PCR Next Gen NOT DETECTED NOT DETECTED Final    Comment: (NOTE) The GeneXpert MRSA Assay (FDA approved for NASAL specimens only), is one component of a comprehensive MRSA colonization surveillance program. It is not intended to diagnose MRSA infection nor to guide or monitor treatment for MRSA  infections. Test performance is not FDA approved in patients less than 31 years old. Performed at [facility], [address] [place], [place] [address]   Respiratory (~20 pathogens) panel by PCR     Status: None   Collection Time: 04/08/24  3:29 PM   Specimen: Nasopharyngeal Swab; Respiratory  Result Value Ref Range Status   Adenovirus NOT DETECTED NOT DETECTED Final   Coronavirus 229E NOT DETECTED NOT DETECTED Final    Comment: (NOTE) The Coronavirus on the Respiratory Panel, DOES NOT test for the novel  Coronavirus (2019 nCoV)    Coronavirus HKU1 NOT DETECTED NOT DETECTED Final   Coronavirus NL63 NOT DETECTED NOT DETECTED Final   Coronavirus OC43 NOT DETECTED NOT DETECTED Final   Metapneumovirus NOT DETECTED NOT DETECTED Final   Rhinovirus / Enterovirus NOT DETECTED NOT DETECTED Final   Influenza A NOT DETECTED NOT  DETECTED Final   Influenza B NOT DETECTED NOT DETECTED Final   Parainfluenza Virus 1 NOT DETECTED NOT DETECTED Final   Parainfluenza Virus 2 NOT DETECTED NOT DETECTED Final   Parainfluenza Virus 3 NOT DETECTED NOT DETECTED Final   Parainfluenza Virus 4 NOT DETECTED NOT DETECTED Final   Respiratory Syncytial Virus NOT DETECTED NOT DETECTED Final   Bordetella pertussis NOT DETECTED NOT DETECTED Final   Bordetella Parapertussis NOT DETECTED NOT DETECTED Final   Chlamydophila pneumoniae NOT DETECTED NOT DETECTED Final   Mycoplasma pneumoniae NOT DETECTED NOT DETECTED Final    Comment: Performed at [facility], [address] [place], [place] [address]  Culture, blood (Routine X 2) w Reflex to ID Panel     Status: None (Preliminary result)   Collection Time: 04/08/24  4:38 PM   Specimen: BLOOD  Result Value Ref Range Status   Specimen Description BLOOD BLOOD LEFT HAND  Final   Special Requests   Final    BOTTLES DRAWN AEROBIC AND ANAEROBIC Blood Culture results may not be optimal due to an inadequate volume of blood received in culture bottles    Culture   Final    NO GROWTH < 24 HOURS Performed at [facility], [address], [place], [place] [address]    Report Status PENDING  Incomplete  Culture, blood (Routine X 2) w Reflex to ID Panel     Status: None (Preliminary result)   Collection Time: 04/08/24  4:38 PM   Specimen: BLOOD  Result Value Ref Range Status   Specimen Description BLOOD BLOOD RIGHT WRIST  Final   Special Requests   Final    BOTTLES DRAWN AEROBIC AND ANAEROBIC Blood Culture adequate volume   Culture   Final    NO GROWTH < 24 HOURS Performed at [facility], [address] [place], [place] [address]    Report Status PENDING  Incomplete         Radiology Studies: MR BRAIN WO CONTRAST Result Date: 04/08/2024 CLINICAL DATA:  Transient ischemic attack EXAM: MRI HEAD WITHOUT CONTRAST TECHNIQUE: Multiplanar, multiecho pulse sequences of the brain and surrounding structures were obtained without intravenous contrast. COMPARISON:  Head CT 04/08/2024 FINDINGS: Markedly motion degraded examination. Only 5 sequences were performed. There is no acute infarct. No midline shift or other mass effect. Otherwise, the examination is limited. IMPRESSION: Markedly motion degraded examination. No acute infarct. Electronically Signed   By: Juanetta Nordmann M.D.   On: 04/08/2024 13:17   DG Chest 2 View Result Date: 04/08/2024 CLINICAL DATA:  Seizure disorder. EXAM: CHEST - 2 VIEW COMPARISON:  January 28, 2024. FINDINGS: Stable cardiomediastinal silhouette. Both lungs are clear. The visualized skeletal structures are unremarkable. IMPRESSION: No active cardiopulmonary disease. Electronically Signed   By: Rosalene Colon M.D.   On: 04/08/2024 12:45   CT Angio Head Neck W WO CM (CODE STROKE) Result Date: 04/08/2024 CLINICAL DATA:  Initial evaluation for acute neuro deficit, stroke. EXAM: CT ANGIOGRAPHY HEAD AND NECK CT PERFUSION BRAIN TECHNIQUE: Multidetector CT imaging of the head and neck was performed using the  standard protocol during bolus administration of intravenous contrast. Multiplanar CT image reconstructions and MIPs were obtained to evaluate the vascular anatomy. Carotid stenosis measurements (when applicable) are obtained utilizing NASCET criteria, using the distal internal carotid diameter as the denominator. Multiphase CT imaging of the brain was performed following IV bolus contrast injection. Subsequent parametric perfusion maps were calculated using RAPID software. RADIATION DOSE REDUCTION: This exam was  performed according to the departmental dose-optimization program which includes automated exposure control, adjustment of the mA and/or kV according to patient size and/or use of iterative reconstruction technique. CONTRAST:  100mL OMNIPAQUE IOHEXOL 350 MG/ML SOLN COMPARISON:  Prior CT from 04/07/2024. FINDINGS: CTA NECK FINDINGS Aortic arch: Standard branching. Imaged portion shows no evidence of aneurysm or dissection. No significant stenosis of the major arch vessel origins. Right carotid system: No evidence of dissection, stenosis (50% or greater) or occlusion. Left carotid system: No evidence of dissection, stenosis (50% or greater) or occlusion. Vertebral arteries: Codominant. No evidence of dissection, stenosis (50% or greater) or occlusion. Skeleton: No worrisome osseous lesions. Other neck: No other acute finding. Upper chest: No other acute finding. Review of the MIP images confirms the above findings CTA HEAD FINDINGS Anterior circulation: Both internal carotid arteries widely patent to the termini without stenosis. A1 segments widely patent. Normal anterior communicating artery complex. Both anterior cerebral arteries widely patent to their distal aspects without stenosis. No M1 stenosis or occlusion. Normal MCA bifurcations. Distal MCA branches well perfused and symmetric. Posterior circulation: Both V4 segments patent without stenosis. Both PICA patent at their origins. 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 Basilar patent without stenosis. Superior cerebellar and posterior cerebral arteries patent bilaterally. Venous sinuses: Patent allowing for timing the contrast bolus. Anatomic variants: None significant.  No aneurysm. Review of the MIP images confirms the above findings CT Brain Perfusion Findings: CBF (<30%) Volume: 0mL Perfusion (Tmax>6.0s) volume: 4mL Mismatch Volume: 4mL Infarction Location:Negative CT perfusion with no evidence for acute core infarct. 4 mL area of delayed perfusion overlies the skull base, consistent with artifact. No other perfusion abnormality. IMPRESSION: 1. Negative CTA of the head and neck. No large vessel occlusion or other emergent finding. No hemodynamically significant or correctable stenosis. 2. Negative CT perfusion with no evidence for acute ischemia or other perfusion abnormality. Electronically Signed   By: Virgia Griffins M.D.   On: 04/08/2024 01:20   CT CEREBRAL PERFUSION W CONTRAST Result Date: 04/08/2024 CLINICAL DATA:  Initial evaluation for acute neuro deficit, stroke. EXAM: CT ANGIOGRAPHY HEAD AND NECK CT PERFUSION BRAIN TECHNIQUE: Multidetector CT imaging of the head and neck was performed using the standard protocol during bolus administration of intravenous contrast. Multiplanar CT image reconstructions and MIPs were obtained to evaluate the vascular anatomy. Carotid stenosis measurements (when applicable) are obtained utilizing NASCET criteria, using the distal internal carotid diameter as the denominator. Multiphase CT imaging of the brain was performed following IV bolus contrast injection. Subsequent parametric perfusion maps were calculated using RAPID software. RADIATION DOSE REDUCTION: This exam was performed according to the departmental dose-optimization program which includes automated exposure control, adjustment of the mA and/or kV according to patient size and/or use of iterative reconstruction  technique. CONTRAST:  100mL OMNIPAQUE IOHEXOL 350 MG/ML SOLN COMPARISON:  Prior CT from 04/07/2024. FINDINGS: CTA NECK FINDINGS Aortic arch: Standard branching. Imaged portion shows no evidence of aneurysm or dissection. No significant stenosis of the major arch vessel origins. Right carotid system: No evidence of dissection, stenosis (50% or greater) or occlusion. Left carotid system: No evidence of dissection, stenosis (50% or greater) or occlusion. Vertebral arteries: Codominant. No evidence of dissection, stenosis (50% or greater) or occlusion. Skeleton: No worrisome osseous lesions. Other neck: No other acute finding. Upper chest: No other acute finding. Review of the MIP images confirms the above findings CTA HEAD FINDINGS Anterior circulation: Both internal carotid arteries widely patent to the termini without  stenosis. A1 segments widely patent. Normal anterior communicating artery complex. Both anterior cerebral arteries widely patent to their distal aspects without stenosis. No M1 stenosis or occlusion. Normal MCA bifurcations. Distal MCA branches well perfused and symmetric. Posterior circulation: Both V4 segments patent without stenosis. Both PICA patent at their origins. 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 Basilar patent without stenosis. Superior cerebellar and posterior cerebral arteries patent bilaterally. Venous sinuses: Patent allowing for timing the contrast bolus. Anatomic variants: None significant.  No aneurysm. Review of the MIP images confirms the above findings CT Brain Perfusion Findings: CBF (<30%) Volume: 0mL Perfusion (Tmax>6.0s) volume: 4mL Mismatch Volume: 4mL Infarction Location:Negative CT perfusion with no evidence for acute core infarct. 4 mL area of delayed perfusion overlies the skull base, consistent with artifact. No other perfusion abnormality. IMPRESSION: 1. Negative CTA of the head and neck. No large vessel occlusion or other emergent  finding. No hemodynamically significant or correctable stenosis. 2. Negative CT perfusion with no evidence for acute ischemia or other perfusion abnormality. Electronically Signed   By: Virgia Griffins M.D.   On: 04/08/2024 01:20   CT Head Wo Contrast Result Date: 04/07/2024 CLINICAL DATA:  New onset seizure. EXAM: CT HEAD WITHOUT CONTRAST TECHNIQUE: Contiguous axial images were obtained from the base of the skull through the vertex without intravenous contrast. RADIATION DOSE REDUCTION: This exam was performed according to the departmental dose-optimization program which includes automated exposure control, adjustment of the mA and/or kV according to patient size and/or use of iterative reconstruction technique. COMPARISON:  None Available. FINDINGS: Brain: There is mild cerebral atrophy with widening of the extra-axial spaces and ventricular dilatation. There are areas of decreased attenuation within the white matter tracts of the supratentorial brain, consistent with microvascular disease changes. Vascular: No hyperdense vessel or unexpected calcification. Skull: Normal. Negative for fracture or focal lesion. Sinuses/Orbits: The right lens is not clearly identified. Other: None. IMPRESSION: 1. 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 2. No acute intracranial abnormality. 3. The right lens is not clearly identified. Correlation with the patient's surgical history is recommended. Electronically Signed   By: Virgle Grime M.D.   On: 04/07/2024 22:31        Scheduled Meds:   stroke: early stages of recovery book   Does not apply Once   Chlorhexidine  Gluconate Cloth  6 each Topical Daily   dexamethasone (DECADRON) injection  10 mg Intravenous Q6H   insulin aspart  0-20 Units Subcutaneous Q4H   insulin glargine-yfgn  20 Units Subcutaneous BID   pantoprazole (PROTONIX) IV  40 mg Intravenous Q24H   rosuvastatin  20 mg Oral Daily   Continuous  Infusions:  acyclovir Stopped (04/09/24 0643)   ampicillin  (OMNIPEN) IV Stopped (04/09/24 0533)   cefTRIAXone (ROCEPHIN)  IV Stopped (04/08/24 2206)   niCARDipine 5 mg/hr (04/09/24 0830)   vancomycin 150 mL/hr at 04/09/24 0647     LOS: 1 day     Iridiana Fonner B Levoy Geisen, MD [facility]  CRITICAL CARE Performed by: Raymonde Calico   Total critical care time: 63 minutes  Critical care time was exclusive of separately billable procedures and treating other patients.  Critical care was necessary to treat or prevent imminent or life-threatening deterioration.  Critical care was time spent personally by me on the following activities: development of treatment plan with patient and/or surrogate as well as nursing, discussions with consultants, evaluation of patient's response to treatment, examination of patient, obtaining history from patient  or surrogate, ordering and performing treatments and interventions, ordering and review of laboratory studies, ordering and review of radiographic studies, pulse oximetry and re-evaluation of patient's condition.    If 7PM-7AM, please contact night-coverage [email or web address] Password [email or web address] 04/09/2024, 8:39 AM

## 2024-04-09 NOTE — Progress Notes (Addendum)
 There was a consult for placing a PIV access. Patient's RN wanted to have two PIV access. Assessed Rt. Arm by US - lower arm ( no suitable veins- too small and deep), placed Rt. Upper cephalic vein. Assessed Lt. Arm by US - lower arm ( no suitable veins- too small and deep), present infusing Cardene through the PIV access on [facility], so couldn't assess upper arm. Informed patient's RN regarding this finding and placed only one PIV access at this time. HS [employer]

## 2024-04-09 NOTE — Progress Notes (Signed)
 NAME:  Samantha Burgess, MRN:  [id number], DOB:  01-28-1961, LOS: 1 ADMISSION DATE:  04/07/2024, CONSULTATION DATE:  04/08/24 REFERRING MD:  Dr. Andy Bannister, CHIEF COMPLAINT:  Seizure, AMS   Brief Pt Description / Synopsis:  63 y.o. female admitted with Acute Metabolic Encephalopathy in the setting of Seizure due to [facility], concern for Todd Paralysis.  Ruling out meningitis.  History of Present Illness:  Samantha Burgess is a 63 y.o. 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 She presented to [facility] ED on 04/07/24  due to witnessed tonic clonic seizure at home.   Patient is currently altered and unable to contribute to history, and no family currently available, therefore history is obtained from chart review.  Per EMS patient was post ictal on arrival. In the field patient CBG was 127. LKW 9pm.Patient transported to ED for further care. Per family patient did not have any bowel or bladder incontinence and had LOC for around 30 sec per chart. Patient currently s/p ativan given for agitation. Patient not able to give history at this time and history is taken from chart as patient is sedated.   On evaluation in ED patient was found thave mild left facial droop and left arm and leg weakness, and confusion .  At that time code stroke was called. CTH was noted to be negative as well as CTA and CT perfusion. Patient was presumed to have Todd's paralysis however CVA could not be ruled out. Patient was then slated for MRI as well as EEG for further work up.  Further evaluation patient was found to have blood sugar of 710 for which she was started on endotool for diagnosis of HHS. Patient also noted to have elevated blood pressure with systolic in  190's.  ED Course: Initial Vital Signs: Temperature 98.2 F orally, pulse 110, respiratory rate 20, blood pressure  164/92, SpO2 96% on room air Significant Labs: Sodium 128, chloride 91, glucose 710, BUN 18, creatinine 1.18, beta hydroxybutyrate acid 0.87 VBG: pH 7.35/pCO2 50/pO2 40/bicarb 27.6 Urinalysis negative for UTI, with 20 ketones UDS positive for tricyclics, ethyl alcohol less than 15 Imaging CT Head w/o contrast (code stroke)>>IMPRESSION: 1. 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 2. No acute intracranial abnormality. 3. The right lens is not clearly identified. Correlation with the patient's surgical history is recommended CTa Head & Neck/Perfusion scan>>IMPRESSION: 1. Negative CTA of the head and neck. No large vessel occlusion or other emergent finding. No hemodynamically significant or correctable stenosis. 2. Negative CT perfusion with no evidence for acute ischemia or other perfusion abnormality Medications Administered: IV fluids, Ativan, Insulin gtt, Nicardipine gtt  Hospitalists asked to admit for further workup and treatment.  Please see "Significant Hospital Events" section below for full detailed hospital course.    Pertinent  Medical History   Past Medical History:  Diagnosis Date   Arthritis 2000   DM (diabetes mellitus) (HCC) 2005   GERD (gastroesophageal reflux disease)    HLD (hyperlipidemia)    HTN (hypertension) 2007   Obesity 1999   Sleep apnea 2007    Micro Data:  5/21: MRSA PCR>> negative 5/21: Blood cultures x2>>  Antimicrobials:   Anti-infectives (From admission, onward)    Start     Dose/Rate Route Frequency Ordered Stop   04/09/24 0600  vancomycin (VANCOREADY) IVPB 750 mg/150 mL  750 mg 150 mL/hr over 60 Minutes Intravenous Every 12 hours 04/08/24 1535     04/08/24 2200  cefTRIAXone (ROCEPHIN) 2 g in sodium chloride  0.9 % 100 mL IVPB        2 g 200 mL/hr over 30 Minutes Intravenous Every 12 hours 04/08/24 1525     04/08/24 1800  ampicillin  (OMNIPEN) 2 g in sodium chloride  0.9 % 100 mL IVPB         2 g 300 mL/hr over 20 Minutes Intravenous Every 6 hours 04/08/24 1525     04/08/24 1700  vancomycin (VANCOREADY) IVPB 750 mg/150 mL  Status:  Discontinued        750 mg 150 mL/hr over 60 Minutes Intravenous Every 12 hours 04/08/24 1533 04/08/24 1535   04/08/24 1700  acyclovir (ZOVIRAX) 750 mg in dextrose 5 % 250 mL IVPB        10 mg/kg  75.1 kg 265 mL/hr over 60 Minutes Intravenous Every 8 hours 04/08/24 1542     04/08/24 1630  vancomycin (VANCOREADY) IVPB 1500 mg/300 mL        1,500 mg 150 mL/hr over 120 Minutes Intravenous  Once 04/08/24 1535 04/08/24 1913        Significant Hospital Events: Including procedures, antibiotic start and stop dates in addition to other pertinent events   5/20: Presented to ED with tonic clonic seizure.  Code stroke initiated, evaluated by TeleNeurology.  CT Head and CTA Heack and Neck/Perfusion scan all negative.   5/21: Admitted by [facility].  MRI brain negative for acute infarct, EEG pending.  PCCM consulted, becoming febrile (T max 102.6), start empiric menigitis coverage, consult IR for LP. 04/09/24- patient was able to say her name today, febrile overnight, for LP today.  Sugars improved from 700 down to 300.  Made 1L uop overnight. Repeating labs including lactate today.  Interim History / Subjective:  As outlined above under "Significant Hospital Events" section  Objective    Blood pressure (!) 159/87, pulse (!) 117, temperature (!) 101 F (38.3 C), temperature source Axillary, resp. rate (!) 28, height 5\' 3"  (1.6 m), weight 80.4 kg, last menstrual period 03/30/2013, SpO2 100%.        Intake/Output Summary (Last 24 hours) at 04/09/2024 0835 Last data filed at 04/09/2024 9562 Gross per 24 hour  Intake 2542.3 ml  Output 2100 ml  Net 442.3 ml   Filed Weights   04/07/24 2204 04/08/24 0330 04/09/24 0500  Weight: 106.6 kg 75.1 kg 80.4 kg    Examination: General: Acutely ill-appearing female, laying in bed, with mild tachypnea, no acute  distress HENT: Atraumatic, normocephalic, neck supple, no JVD Lungs: Clear breath sounds throughout, even, mild tachypnea Cardiovascular: Tachycardia, regular rhythm, s1s2, no M/R/G Abdomen: Limited abdominal exam given AMS: Soft, nontender, nondistended, no guarding or rebound tenderness, BS + x4 Extremities: Normal bulk and tone, no deformities, no edema Neuro: Lethargic, withdraws from painful stimuli and moans GU: Deferred  Resolved Hospital Problem list     Assessment & Plan:   #Acute Metabolic Encephalopathy - possible meningitis #New onset Seizure, suspect due to marked Hyperglycemia #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 #Rule out meningitis  -CT Head, CTa Head and Neck, CT perfusion all negative -MRI Brain negative for acute infarct -Treatment of metabolic derangements as outlined below -Seizure precautions -Prn Benzo's for recurrent seizures -Provide supportive care -Promote normal sleep/wake cycle and family presence -Avoid sedating medications as able -EEG pending -Neurology following, appreciate input ~ does not recommend AED at this time -ABX  as above -PT/OT/Speech evaluations when mental status permits -Currently protecting her airway -LP - attempted unsucessfully by IR  #Hyperosmolar Hyperglycemia Stage -Follow HHS Protocol -Aggressive IV fluids -Already converted off Insulin gtt to Long acting insulin (Semglee 20 unit BID) + SSI -Diabetes Coordinator consulted, appreciate input -Check Hgb A1c  #Fever #Mild Leukocytosis #Concern for possible Meningitis -Monitor fever curve -Trend WBC's & Procalcitonin -Follow cultures as above -Start empiric Ampicillin , Acyclovir, Ceftriaxone, and Vancomycin pending cultures & sensitivities -Start Decadron prior to/with antibiotics -Consult IR for LP  #Hypertension PMHx: HLD -Continuous cardiac monitoring -Maintain MAP >65 -IV fluids -Allow for permissive HTN  x48 hrs from sx onset per Neuro recs ~ goal BP <220/110 (15%  reduction in 1st 24 hrs) -Prn Labetalol & Nicardipine gtt if needed   #Pseudo Hyponatremia in setting of marked Hyperglycemia #Acute Kidney Injury -Monitor I&O's / urinary output -Follow BMP -Ensure adequate renal perfusion -Avoid nephrotoxic agents as able -Replace electrolytes as indicated ~ Pharmacy following for assistance with electrolyte replacement    Best Practice (right click and "Reselect all SmartList Selections" daily)   Diet/type: NPO DVT prophylaxis: SCD GI prophylaxis: PPI Lines: N/A Foley:  N/A Code Status:  full code Last date of multidisciplinary goals of care discussion [N/A]   Labs   CBC: Recent Labs  Lab 04/07/24 2208 04/08/24 0836 04/09/24 0315  WBC 6.8 11.5* 11.5*  NEUTROABS 3.7  --   --   HGB 13.1 13.2 14.8  HCT 39.9 40.1 43.8  MCV 85.4 85.0 84.2  PLT 392 402* 401*    Basic Metabolic Panel: Recent Labs  Lab 04/07/24 2208 04/08/24 0828 04/09/24 0315  NA 128* 133* 133*  K 3.7 3.7 3.9  CL 91* 98 99  CO2 22 22 22   GLUCOSE 710* 269* 294*  BUN 18 12 9   CREATININE 1.18* 0.74 0.76  CALCIUM 9.3 9.2 9.0  MG  --  1.7 2.0  PHOS  --  2.1* 2.8   GFR: Estimated Creatinine Clearance: 73.2 mL/min (by C-G formula based on SCr of 0.76 mg/dL). Recent Labs  Lab 04/07/24 2208 04/08/24 0828 04/08/24 0836 04/08/24 1056 04/08/24 1332 04/09/24 0315  PROCALCITON  --   --   --   --  <0.10  --   WBC 6.8  --  11.5*  --   --  11.5*  LATICACIDVEN  --  2.8*  --  2.4*  --   --     Liver Function Tests: Recent Labs  Lab 04/08/24 0828  AST 22  ALT 16  ALKPHOS 128*  BILITOT 0.5  PROT 7.3  ALBUMIN 3.9   No results for input(s): "LIPASE", "AMYLASE" in the last 168 hours. No results for input(s): "AMMONIA" in the last 168 hours.  ABG    Component Value Date/Time   HCO3 31.1 (H) 04/08/2024 1332   O2SAT 38.9 04/08/2024 1332     Coagulation Profile: Recent Labs  Lab 04/07/24 2208  INR 1.0    Cardiac Enzymes: No results for input(s):  "CKTOTAL", "CKMB", "CKMBINDEX", "TROPONINI" in the last 168 hours.  HbA1C: No results found for: "HGBA1C"  CBG: Recent Labs  Lab 04/08/24 1929 04/08/24 2126 04/08/24 2322 04/09/24 0410 04/09/24 0745  GLUCAP 216* 266* 342* 319* 308*    Review of Systems:   Unable to assess due to AMS   Past Medical History:  She,  has a past medical history of Arthritis (2000), DM (diabetes mellitus) (HCC) (2005), GERD (gastroesophageal reflux disease), HLD (hyperlipidemia), HTN (hypertension) (2007), Obesity (1999),  and Sleep apnea (2007).   Surgical History:   Past Surgical History:  Procedure Laterality Date   CESAREAN SECTION     IR FLUORO GUIDE CV LINE RIGHT  05/10/2017   KNEE ARTHROSCOPY Right    UPPER GASTROINTESTINAL ENDOSCOPY       Social History:   reports that she has never smoked. She has never used smokeless tobacco. She reports that she does not drink alcohol and does not use drugs.   Family History:  Her family history includes Diabetes in her maternal grandmother and mother; Esophageal cancer in her paternal grandmother. There is no history of Colon cancer, Rectal cancer, or Stomach cancer.   Allergies No Known Allergies   Home Medications  Prior to Admission medications   Medication Sig Start Date End Date Taking? Authorizing Provider  albuterol  (VENTOLIN  HFA) 108 (90 Base) MCG/ACT inhaler Inhale 2 puffs into the lungs every 4 (four) hours as needed for shortness of breath. 01/28/24   Menshew, Raye Cai, PA-C  amitriptyline (ELAVIL) 10 MG tablet Take 40-50 mg by mouth at bedtime.    [provider]  benzonatate  (TESSALON  PERLES) 100 MG capsule Take 1-2 tabs TID prn cough 01/28/24   Menshew, Jenise V Bacon, PA-C  fluticasone  (FLONASE ) 50 MCG/ACT nasal spray Place 1 spray into both nostrils daily. 01/28/24   Menshew, Raye Cai, PA-C  hydrochlorothiazide (HYDRODIURIL) 25 MG tablet Take 25 mg by mouth daily.    [provider]  insulin aspart  protamine- aspart (NOVOLOG MIX 70/30) (70-30) 100 UNIT/ML injection Inject into the skin.    [provider]  insulin detemir (LEVEMIR) 100 UNIT/ML injection Inject 30 Units into the skin at bedtime.    [provider]  insulin lispro (HUMALOG) 100 UNIT/ML injection Inject 50 Units into the skin 2 (two) times daily.    [provider]  oxyCODONE-acetaminophen  (PERCOCET) 10-325 MG per tablet Take 1 tablet by mouth 4 (four) times daily as needed for pain.    [provider]  ramipril (ALTACE) 10 MG capsule Take 10 mg by mouth daily.    [provider]  simvastatin (ZOCOR) 40 MG tablet Take 40 mg by mouth daily.    [provider]  sitaGLIPtan-metformin (JANUMET) 50-1000 MG per tablet Take 1 tablet by mouth 2 (two) times daily with a meal.    [provider]     Critical care provider statement:   Total critical care time: 33 minutes   Performed by: Jaclynn Mast MD   Critical care time was exclusive of separately billable procedures and treating other patients.   Critical care was necessary to treat or prevent imminent or life-threatening deterioration.   Critical care was time spent personally by me on the following activities: development of treatment plan with patient and/or surrogate as well as nursing, discussions with consultants, evaluation of patient's response to treatment, examination of patient, obtaining history from patient or surrogate, ordering and performing treatments and interventions, ordering and review of laboratory studies, ordering and review of radiographic studies, pulse oximetry and re-evaluation of patient's condition.    Anni Hocevar, M.D.  Pulmonary & Critical Care Medicine

## 2024-04-09 NOTE — Progress Notes (Addendum)
 Neurology progress note  S: Patient remained altered yesterday and also developed fever and leukocytosis. Due to concern for possible meningitis patient was started on empiric CNS coverage with vanc, ceftriaxone, ampicillin , and acyclovir. LP was performed which showed pleiocytosis in the setting of traumatic tap, high protein, meningitis/encephalitis PCR panel was negative  O:  Exam  Vitals:   04/09/24 0815 04/09/24 0830  BP: (!) 149/97 (!) 145/83  Pulse: (!) 117 (!) 118  Resp: (!) 22 20  Temp:    SpO2: 99% 98%   Gen: patient lying in bed, NAD CV: extremities appear well-perfused Resp: normal WOB  Neurologic exam MS: lethargic, oriented x3, follows commands Speech: mild dysarthria CN: PERRL, blinks to threat bilat, EOMI, sensation intact, face symmetric, hearing intact to voice Motor: at least 4-/5 in all extremities effort-dependent, declines testing of LLE 2/2 pain from fall Sensory: SILT Coordination: does not participate Gait: deferred  Data  MRI brain personally reviewed - marked motion artifact, no clear evidence of acute infarct rEEG - mild-to-moderate diffuse slowing 6-7 Hz, no epileptiform abnl, no seizures  A/P: 63 yo woman with hx DM, HTN, HL p/w first-time seizure (GTC) x2. In ED was noted to have mild L facial droop and L-sided weakness with confusion. Stroke code was activated (done by telespecialists) and CT head, CTA H&N, and CTP WNL. Etiology of weakness favored to be post-ictal Todd's paralysis given no e/o acute infarct on MRI. She was also found to have BG 710 and findings c/w hyperosmolar non-ketotic state. Etiology of seizure was favored to be severe hyperglycemia but due to the fact that she remained persistently altered and we could not monitor at [facility] with continuous EEG she was started on keppra 500mg  bid. She has not had any seizures since admission.  Subsequently she developed fevers and leukocytosis. Due to concern for possible meningitis patient was  started on empiric CNS coverage with vanc, ceftriaxone, ampicillin , and acyclovir. LP was performed which showed pleiocytosis in the setting of traumatic tap, high protein, meningitis/encephalitis PCR panel was negative. Per ID, partially treated meningitis remains on the ddx. Dr. Francee Inch also contemplated DVST as possible etiology for the seizure and I have ordered CTV head for further evaluation.  MRI brain showed no e/o PRES but hypertensive encephalopathy could be contributing to her AMS, as well as the multiple sedating medications that she has received. AMS has improved since admission.  - Abx per ID - F/u CTV - Keppra 500mg  bid - Will continue to follow  Greg Leaks, MD [facility] [phone]  If 7pm- 7am, please page neurology on call as listed in AMION.

## 2024-04-09 NOTE — Inpatient Diabetes Management (Addendum)
 Inpatient Diabetes Program Recommendations  AACE/ADA: New Consensus Statement on Inpatient Glycemic Control (2015)  Target Ranges:  Prepandial:   less than 140 mg/dL      Peak postprandial:   less than 180 mg/dL (1-2 hours)      Critically ill patients:  140 - 180 mg/dL   Lab Results  Component Value Date   GLUCAP 308 (H) 04/09/2024   HGBA1C >15.5 (H) 04/08/2024    Review of Glycemic Control  Latest Reference Range & Units 04/08/24 16:45 04/08/24 19:29 04/08/24 21:26 04/08/24 23:22 04/09/24 04:10 04/09/24 07:45  Glucose-Capillary 70 - 99 mg/dL 295 (H) 621 (H) 308 (H) 342 (H) 319 (H) 308 (H)   Diabetes history: DM 2 Outpatient Diabetes medications:  Levemir 30 units q HS Humalog 50 units bid Janumet 50-1000 mg bid Current orders for Inpatient glycemic control:  Novolog 0-20 units q 4 hours Semglee 22 units bid Decadron 10 mg IV q 6 hours  Inpatient Diabetes Program Recommendations:    Please consider increasing Semglee to 35 units bid.  Note that A1C indicates that average blood sugars were >400 mg/dL over the past 3 months.  Will follow.    Thanks,  Josefa Ni, RN, BC-ADM Inpatient Diabetes Coordinator Pager [phone]  (8a-5p)

## 2024-04-09 NOTE — Evaluation (Signed)
 Physical Therapy Evaluation Patient Details Name: Francheska Villeda MRN: [id number] DOB: 09/16/1961 Today's Date: 04/09/2024  History of Present Illness  Dann Strum is a 63 y.o. 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 Patient presents to  s/ witnessed tonic clonic seizure at home. Per EMS patient was post ictal on arrival. In the field patient CBG was 127.   Clinical Impression  Patient received in bed sleeping. Mitts donned. Patient is very lethargic. She is able to state her name. Patient requires +2 total assist for bed mobility at this time due to confusion, lethargy. She will continue to benefit from skilled PT to improve mobility and independence.       If plan is discharge home, recommend the following: Two people to help with walking and/or transfers;Two people to help with bathing/dressing/bathroom   Can travel by private vehicle   No    Equipment Recommendations Other (comment) (TBD)  Recommendations for Other Services       Functional Status Assessment Patient has had a recent decline in their functional status and demonstrates the ability to make significant improvements in function in a reasonable and predictable amount of time.     Precautions / Restrictions Precautions Precautions: Fall Recall of Precautions/Restrictions: Impaired Restrictions Weight Bearing Restrictions Per Provider Order: No      Mobility  Bed Mobility Overal bed mobility: Needs Assistance Bed Mobility: Sit to Supine, Supine to Sit     Supine to sit: Total assist, +2 for physical assistance Sit to supine: Total assist, +2 for physical assistance        Transfers                   General transfer comment: not attempted    Ambulation/Gait               General Gait Details: unable  Stairs             Wheelchair Mobility     Tilt Bed    Modified Rankin (Stroke Patients Only)       Balance Overall balance assessment: Needs assistance Sitting-balance support: Feet supported Sitting balance-Leahy Scale: Poor                                       Pertinent Vitals/Pain Pain Assessment Pain Assessment: PAINAD Breathing: normal Negative Vocalization: none Facial Expression: smiling or inexpressive Body Language: relaxed Consolability: no need to console PAINAD Score: 0    Home Living Family/patient expects to be discharged to:: Private residence Living Arrangements: Children                 Additional Comments: patient unable to provide history due to LOC    Prior Function                       Extremity/Trunk Assessment   Upper Extremity Assessment Upper Extremity Assessment: Defer to OT evaluation    Lower Extremity Assessment Lower Extremity Assessment: Generalized weakness    Cervical / Trunk Assessment Cervical / Trunk Assessment: Normal  Communication   Communication Communication: Impaired Factors Affecting Communication: Difficulty expressing self    Cognition Arousal: Lethargic Behavior During Therapy: WFL for tasks assessed/performed   PT - Cognitive impairments: No family/caregiver present  to determine baseline                       PT - Cognition Comments: patient is able to state name only Following commands: Impaired Following commands impaired: Follows one step commands inconsistently     Cueing Cueing Techniques: Verbal cues, Gestural cues, Tactile cues     General Comments      Exercises     Assessment/Plan    PT Assessment Patient needs continued PT services  PT Problem List Decreased strength;Decreased activity tolerance;Decreased balance;Decreased mobility;Decreased cognition;Decreased safety awareness;Cardiopulmonary status limiting activity;Decreased coordination       PT  Treatment Interventions DME instruction;Gait training;Stair training;Functional mobility training;Therapeutic activities;Therapeutic exercise;Balance training;Neuromuscular re-education;Cognitive remediation;Patient/family education    PT Goals (Current goals can be found in the Care Plan section)  Acute Rehab PT Goals Patient Stated Goal: none stated PT Goal Formulation: Patient unable to participate in goal setting Time For Goal Achievement: 04/23/24    Frequency Min 2X/week     Co-evaluation               AM-PAC PT "6 Clicks" Mobility  Outcome Measure Help needed turning from your back to your side while in a flat bed without using bedrails?: Total Help needed moving from lying on your back to sitting on the side of a flat bed without using bedrails?: Total Help needed moving to and from a bed to a chair (including a wheelchair)?: Total Help needed standing up from a chair using your arms (e.g., wheelchair or bedside chair)?: Total Help needed to walk in hospital room?: Total Help needed climbing 3-5 steps with a railing? : Total 6 Click Score: 6    End of Session Equipment Utilized During Treatment: Oxygen Activity Tolerance: Patient limited by lethargy Patient left: in bed;with call bell/phone within reach;with bed alarm set;with nursing/sitter in room Nurse Communication: Mobility status PT Visit Diagnosis: Other abnormalities of gait and mobility (R26.89)    Time: 4098-1191 PT Time Calculation (min) (ACUTE ONLY): 10 min   Charges:   PT Evaluation $PT Eval Moderate Complexity: 1 Mod   PT General Charges $$ ACUTE PT VISIT: 1 Visit         Lezette Kitts, PT, GCS 04/09/24,10:12 AM

## 2024-04-09 NOTE — Procedures (Signed)
 Routine EEG Report  Samantha Burgess is a 63 y.o. female with a history of seizure who is undergoing an EEG to evaluate for seizures.  Report: This EEG was acquired with electrodes placed according to the International 10-20 electrode system (including Fp1, Fp2, F3, F4, C3, C4, P3, P4, O1, O2, T3, T4, T5, T6, A1, A2, Fz, Cz, Pz). The following electrodes were missing or displaced: none.  The occipital dominant rhythm was 6-7 Hz. This activity is reactive to stimulation. Drowsiness was manifested by background fragmentation; deeper stages of sleep were identified by K complexes and sleep spindles. There was no focal slowing. There were no interictal epileptiform discharges. There were no electrographic seizures identified. There was no abnormal response to photic stimulation or hyperventilation.   Impression and clinical correlation: This EEG was obtained while awake and asleep and is abnormal due to mild-to-moderate diffuse slowing indicative of global cerebral dysfunction. Epileptiform abnormalities were not seen during this recording.  Greg Leaks, MD [facility] [phone]  If 7pm- 7am, please page neurology on call as listed in AMION.

## 2024-04-09 NOTE — Progress Notes (Signed)
 Speech Language Pathology Treatment: Dysphagia  Patient Details Name: Samantha Burgess MRN: [id number] DOB: 1961-02-28 Today's Date: 04/09/2024 Time: 6962-9528 SLP Time Calculation (min) (ACUTE ONLY): 12 min  Assessment / Plan / Recommendation Clinical Impression  Pt seen for ongoing cognitive assessment and assess pt for PO readiness. Pt was laying in bed, sleeping (snoring), her daughter and pt's cousin were present in pt's room. This Clinical research associate provided education on ST services (both cognitive and swallowing) and extensive education provided on increased risk of aspiration given the severity of pt's confusion as reported throughout the day as well as current lethargic state. Her daughter reports that pt spoke with her in sentences earlier today and recognized her. Hopeful that pt will continue to improve and able to demonstrate improved alertness and ability to follow directions throughout the day. All questions answered to daughter's satisfaction. ST services to follow.    HPI HPI: Samantha Burgess is a 63 y.o. 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 Patient presents to  s/ witnessed tonic clonic seizure at home. Per EMS patient was post ictal on arrival. In the field patient CBG was 127. LKW 9pm.Patient transported to ED for further care. Per family patient did not have any bowel or bladder incontinence and had LOC for around 30 sec per chart.      SLP Plan  Continue with current plan of care      Recommendations for follow up therapy are one component of a multi-disciplinary discharge planning process, led by the attending physician.  Recommendations may be updated based on patient status, additional functional criteria and insurance authorization.    Recommendations  Diet recommendations: NPO Medication Administration: Via  alternative means                  Oral care QID     Cognitive communication deficit (R41.841)     Continue with current plan of care    Jagjit Riner B. Garlin Junker, M.S., CCC-SLP, Tree surgeon Certified Brain Injury Specialist [facility]  [facility] Office [phone] Ascom [phone] Fax [phone]

## 2024-04-09 NOTE — Procedures (Signed)
 VIR note  Patient required sedation during LP, and thus VIR was involved. Attempt earlier was aborted secondary to agitation/safety issue.  Procedure: Fluoro guided LP, 2 location: L5, then L4  Sedation: mod sed  EBL: none  Complications: none  Findings:  No elevation of the CSF pressure.  ~40minutes to collect 12cc  12cc of CSF.  Vials 1 & 2 pink-tinged, cleared somewhat for vials 3 & 4  Recs: - follow up labs - dry band-aid as needed x 24 hours

## 2024-04-10 ENCOUNTER — Encounter: Payer: Self-pay | Admitting: Family Medicine

## 2024-04-10 ENCOUNTER — Inpatient Hospital Stay

## 2024-04-10 DIAGNOSIS — D729 Disorder of white blood cells, unspecified: Secondary | ICD-10-CM

## 2024-04-10 DIAGNOSIS — R739 Hyperglycemia, unspecified: Secondary | ICD-10-CM

## 2024-04-10 DIAGNOSIS — I674 Hypertensive encephalopathy: Secondary | ICD-10-CM

## 2024-04-10 DIAGNOSIS — D72829 Elevated white blood cell count, unspecified: Secondary | ICD-10-CM

## 2024-04-10 DIAGNOSIS — R509 Fever, unspecified: Secondary | ICD-10-CM

## 2024-04-10 DIAGNOSIS — R569 Unspecified convulsions: Secondary | ICD-10-CM | POA: Diagnosis not present

## 2024-04-10 DIAGNOSIS — G039 Meningitis, unspecified: Secondary | ICD-10-CM

## 2024-04-10 LAB — BASIC METABOLIC PANEL WITH GFR
Anion gap: 7 (ref 5–15)
BUN: 24 mg/dL — ABNORMAL HIGH (ref 8–23)
CO2: 24 mmol/L (ref 22–32)
Calcium: 8.3 mg/dL — ABNORMAL LOW (ref 8.9–10.3)
Chloride: 107 mmol/L (ref 98–111)
Creatinine, Ser: 0.92 mg/dL (ref 0.44–1.00)
GFR, Estimated: >60 mL/min (ref >60)
Glucose, Bld: 94 mg/dL (ref 70–99)
Potassium: 3.4 mmol/L — ABNORMAL LOW (ref 3.5–5.1)
Sodium: 138 mmol/L (ref 135–145)

## 2024-04-10 LAB — CBC
HCT: 35.1 % — ABNORMAL LOW (ref 36.0–46.0)
Hemoglobin: 11.8 g/dL — ABNORMAL LOW (ref 12.0–15.0)
MCH: 28.8 pg (ref 26.0–34.0)
MCHC: 33.6 g/dL (ref 30.0–36.0)
MCV: 85.6 fL (ref 80.0–100.0)
Platelets: 342 10*3/uL (ref 150–400)
RBC: 4.1 MIL/uL (ref 3.87–5.11)
RDW: 13.3 % (ref 11.5–15.5)
WBC: 10.8 10*3/uL — ABNORMAL HIGH (ref 4.0–10.5)
nRBC: 0 % (ref 0.0–0.2)

## 2024-04-10 LAB — PHOSPHORUS: Phosphorus: 4.2 mg/dL (ref 2.5–4.6)

## 2024-04-10 LAB — VANCOMYCIN, TROUGH: Vancomycin Tr: 13 ug/mL — ABNORMAL LOW (ref 15–20)

## 2024-04-10 LAB — GLUCOSE, CAPILLARY
Glucose-Capillary: 56 mg/dL — ABNORMAL LOW (ref 70–99)
Glucose-Capillary: 60 mg/dL — ABNORMAL LOW (ref 70–99)
Glucose-Capillary: 106 mg/dL — ABNORMAL HIGH (ref 70–99)
Glucose-Capillary: 137 mg/dL — ABNORMAL HIGH (ref 70–99)
Glucose-Capillary: 215 mg/dL — ABNORMAL HIGH (ref 70–99)
Glucose-Capillary: 202 mg/dL — ABNORMAL HIGH (ref 70–99)

## 2024-04-10 LAB — CK: Total CK: 46 U/L (ref 38–234)

## 2024-04-10 LAB — MAGNESIUM: Magnesium: 2 mg/dL (ref 1.7–2.4)

## 2024-04-10 MED ORDER — IOHEXOL 300 MG/ML  SOLN
100.0000 mL | Freq: Once | INTRAMUSCULAR | Status: AC | PRN
  Administered 2024-04-10: 100 mL via INTRAVENOUS

## 2024-04-10 MED ORDER — DEXTROSE 50 % IV SOLN
INTRAVENOUS | Status: AC
  Filled 2024-04-10: qty 50

## 2024-04-10 MED ORDER — DEXTROSE 50 % IV SOLN
12.5000 g | Freq: Once | INTRAVENOUS | Status: AC
Start: 2024-04-10 — End: 2024-04-10
  Administered 2024-04-10: 12.5 g via INTRAVENOUS

## 2024-04-10 MED ORDER — HYDROCHLOROTHIAZIDE 25 MG PO TABS
25.0000 mg | ORAL_TABLET | Freq: Every day | ORAL | Status: DC
  Administered 2024-04-11 – 2024-04-12 (×2): 25 mg via ORAL
  Filled 2024-04-10 (×2): qty 1

## 2024-04-10 MED ORDER — INSULIN ASPART 100 UNIT/ML IJ SOLN
0.0000 [IU] | Freq: Three times a day (TID) | INTRAMUSCULAR | Status: DC
  Administered 2024-04-10 – 2024-04-11 (×3): 5 [IU] via SUBCUTANEOUS
  Administered 2024-04-11 – 2024-04-12 (×2): 3 [IU] via SUBCUTANEOUS
  Administered 2024-04-12: 2 [IU] via SUBCUTANEOUS
  Filled 2024-04-10 (×6): qty 1

## 2024-04-10 MED ORDER — VANCOMYCIN HCL IN DEXTROSE 1-5 GM/200ML-% IV SOLN
1000.0000 mg | Freq: Two times a day (BID) | INTRAVENOUS | Status: DC
  Administered 2024-04-10 – 2024-04-11 (×2): 1000 mg via INTRAVENOUS
  Filled 2024-04-10 (×2): qty 200

## 2024-04-10 MED ORDER — ACETAMINOPHEN 325 MG PO TABS
650.0000 mg | ORAL_TABLET | Freq: Four times a day (QID) | ORAL | Status: DC | PRN
  Administered 2024-04-10 – 2024-04-11 (×4): 650 mg via ORAL
  Filled 2024-04-10 (×4): qty 2

## 2024-04-10 MED ORDER — INSULIN ASPART 100 UNIT/ML IJ SOLN
0.0000 [IU] | Freq: Every day | INTRAMUSCULAR | Status: DC
  Administered 2024-04-10 – 2024-04-11 (×2): 2 [IU] via SUBCUTANEOUS
  Filled 2024-04-10 (×2): qty 1

## 2024-04-10 MED ORDER — POTASSIUM CHLORIDE 10 MEQ/100ML IV SOLN
10.0000 meq | INTRAVENOUS | Status: AC
  Administered 2024-04-10 (×2): 10 meq via INTRAVENOUS
  Filled 2024-04-10 (×2): qty 100

## 2024-04-10 MED ORDER — RAMIPRIL 5 MG PO CAPS
10.0000 mg | ORAL_CAPSULE | Freq: Every day | ORAL | Status: DC
  Administered 2024-04-10 – 2024-04-12 (×3): 10 mg via ORAL
  Filled 2024-04-10 (×2): qty 2
  Filled 2024-04-10: qty 1
  Filled 2024-04-10: qty 2

## 2024-04-10 NOTE — Plan of Care (Signed)
  Problem: Health Behavior/Discharge Planning: Goal: Ability to manage health-related needs will improve Outcome: Progressing   Problem: Health Behavior/Discharge Planning: Goal: Ability to manage health-related needs will improve Outcome: Progressing   Problem: Clinical Measurements: Goal: Respiratory complications will improve Outcome: Progressing   Problem: Clinical Measurements: Goal: Respiratory complications will improve Outcome: Progressing   Problem: Coping: Goal: Level of anxiety will decrease Outcome: Progressing   Problem: Coping: Goal: Level of anxiety will decrease Outcome: Progressing   Problem: Safety: Goal: Ability to remain free from injury will improve Outcome: Progressing   Problem: Safety: Goal: Ability to remain free from injury will improve Outcome: Progressing

## 2024-04-10 NOTE — Progress Notes (Signed)
 Nutrition Brief Note  RD asked to assess during rounds due to lethargy and possible need for enteral nutrition support.   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   Pt admitted with New Onset Seziure with ? Of Todd's paralysis r/o CVA /TIA.  5/21- s/p BSE- NPO 5/22- LP unsuccessful, s/p BSE- NPO  Reviewed I/O's: +1.3 L x 24 hours and +1.2 L since admission  UOP: 1.1 L x 24 hours  Case discussed with medical team yesterday. Pt was very lethargic and unable to participate in BSE. Pt may require alterative means of nutrition and hydration.   Case discussed with RN prior to visit, who reports pt's mentation has improved and SLP is palling to see pt. Pt able to arouse and ask questions per RN report.   Pt sleeping soundly at time of visit, arose easily to voice. Pt very eager to engage RD in conversation and was able to recall events leading to her hospitalization. Pt reports feeling well today. Pt recalls being altered yesterday, but shares that he is hungry and ready to eat. RD discussed importance of SLP eval for PO clearance.   Pt reports good appetite PTA. Pt Usually consumes 2-3 meals per day (Breakfast: eggs, bacon, toast, and fruit, Snack: Fruit, Dinner: meat, starch, and vegetable, PM snack: fruit). PTA pt reports compliance with medications and no difficulty chewing or swallowing.   Reviewed wt hx; noted inconsistencies of wt during admission; question accuracy of weights.   Nutrition-Focused physical exam completed. Findings are no fat depletion, no muscle depletion, and  no edema.    Per SLP, pt has been cleared to a regular diet with thin liquids after eval.   Medications reviewed and include decadron, protonix, and keppra.   Lab Results  Component Value Date   HGBA1C >15.5 (H) 04/08/2024   PTA DM medications are 50-1000 mg janumet BID, 50 units humalog BID, and 30 units levemir BID.   Labs reviewed: K: 3.4, CBGS: 56-337 (inpatient orders for glycemic control are 0-20 units insulin aspart every 4 hours and 4 units insulin aspart every 4 hours).    Body mass index is 31.4 kg/m. Patient meets criteria for obesity, class I based on current BMI. Obesity is a complex, chronic medical condition that is optimally managed by a multidisciplinary care team. Weight loss is not an ideal goal for an acute inpatient hospitalization. However, if further work-up for obesity is warranted, consider outpatient referral to [facility].    Current diet order is regular, patient is consuming approximately n/a% of meals at this time. Labs and medications reviewed.   No nutrition interventions warranted at this time. If nutrition issues arise, please consult RD.   Herschel Lords, RD, LDN, CDCES Registered Dietitian III Certified Diabetes Care and Education Specialist If unable to reach this RD, please use "RD Inpatient" group chat on secure chat between hours of 8am-4 pm daily

## 2024-04-10 NOTE — Progress Notes (Signed)
 Occupational Therapy Treatment Patient Details Name: Samantha Burgess MRN: [id number] DOB: 1961/05/30 Today's Date: 04/10/2024   History of present illness Samantha Burgess is a 62 y.o. 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 Patient presents to  s/ witnessed tonic clonic seizure at home. Per EMS patient was post ictal on arrival. In the field patient CBG was 127.   OT comments  Chart reviewed to date, pt greeted semi supine in bed, alert and oriented x4, improved cognition/participation as compared to yesterday's evaluation. Pt is able to provide home set up information- she lives with her daughter and teenage grandson in a townhouse with a level entry, her bedroom/bathroom is upstairs. PTA she was MOD I-I in ADL/IADL and used a SPC for PRN use due to arthritis per her report. Pt does appear to present with some processing delays and mild safety awareness deficits, will continue to assess. Improvements noted with mobility, see further details below. Pt is left in chair, all needs met. OT will continue to follow acutely.        If plan is discharge home, recommend the following:  A little help with walking and/or transfers;A little help with bathing/dressing/bathroom;Supervision due to cognitive status   Equipment Recommendations  BSC/3in1;Other (comment);Tub/shower seat (2WW)    Recommendations for Other Services      Precautions / Restrictions Precautions Precautions: Fall Recall of Precautions/Restrictions: Impaired Restrictions Weight Bearing Restrictions Per Provider Order: No       Mobility Bed Mobility Overal bed mobility: Needs Assistance Bed Mobility: Supine to Sit     Supine to sit: Contact guard, HOB elevated, Used rails          Transfers Overall transfer level: Needs assistance Equipment used: None,  Rolling walker (2 wheels) Transfers: Sit to/from Stand Sit to Stand: Min assist, Contact guard assist           General transfer comment: CGA with RW, MIN A with no AD; intermittent vcs for technique     Balance Overall balance assessment: Needs assistance Sitting-balance support: Feet supported Sitting balance-Leahy Scale: Good     Standing balance support: Bilateral upper extremity supported, During functional activity, Reliant on assistive device for balance Standing balance-Leahy Scale: Poor                             ADL either performed or assessed with clinical judgement   ADL Overall ADL's : Needs assistance/impaired Eating/Feeding: Set up;Sitting   Grooming: Wash/dry face;Sitting;Supervision/safety               Lower Body Dressing: Maximal assistance;Sitting/lateral leans Lower Body Dressing Details (indicate cue type and reason): socks Toilet Transfer: Minimal assistance;+2 for safety/equipment Toilet Transfer Details (indicate cue type and reason): simulated to bedside chair with no AD, one L LOB when standing         Functional mobility during ADLs: Minimal assistance;Rolling walker (2 wheels);+2 for safety/equipment (approx 8' with RW, pt with L lateral LOB)      Extremity/Trunk Assessment              Vision Patient Visual Report: No change from baseline Additional Comments: pt reports she had no activity restrictions   Perception     Praxis     Communication Communication Communication: No apparent difficulties   Cognition Arousal: Alert  Behavior During Therapy: WFL for tasks assessed/performed Cognition: Cognition impaired       Memory impairment (select all impairments): Declarative long-term memory Attention impairment (select first level of impairment): Selective attention Executive functioning impairment (select all impairments): Problem solving, Reasoning OT - Cognition Comments: will continue to assess                  Following commands: Impaired Following commands impaired: Follows one step commands with increased time      Cueing   Cueing Techniques: Verbal cues, Gestural cues, Tactile cues  Exercises Other Exercises Other Exercises: edu re: role of OT, role of rehab, discharge recommendations, safe ADL completion, fall prevention    Shoulder Instructions       General Comments vss throughout    Pertinent Vitals/ Pain       Pain Assessment Pain Assessment: CPOT Facial Expression: Relaxed, neutral Body Movements: Protection Muscle Tension: Relaxed Compliance with ventilator (intubated pts.): N/A Vocalization (extubated pts.): Talking in normal tone or no sound CPOT Total: 1 Pain Intervention(s): Monitored during session, Repositioned, Limited activity within patient's tolerance  Home Living Family/patient expects to be discharged to:: Private residence Living Arrangements: Children Available Help at Discharge: Family (daugther is currently on mat leave) Type of Home: Other(Comment) (townhouse) Home Access: Level entry     Home Layout: Multi-level;Able to live on main level with bedroom/bathroom;1/2 bath on main level Alternate Level Stairs-Number of Steps: 12 Alternate Level Stairs-Rails: Left Bathroom Shower/Tub: Tub/shower unit;Walk-in shower   Bathroom Toilet: Standard Bathroom Accessibility: Yes   Home Equipment: Cane - single point          Prior Functioning/Environment              Frequency  Min 2X/week        Progress Toward Goals  OT Goals(current goals can now be found in the care plan section)  Progress towards OT goals: Progressing toward goals  Acute Rehab OT Goals Time For Goal Achievement: 04/23/24  Plan      Co-evaluation    PT/OT/SLP Co-Evaluation/Treatment: Yes Reason for Co-Treatment: Complexity of the patient's impairments (multi-system involvement);Necessary to address cognition/behavior during functional activity;To  address functional/ADL transfers;For patient/therapist safety   OT goals addressed during session: ADL's and self-care      AM-PAC OT "6 Clicks" Daily Activity     Outcome Measure   Help from another person eating meals?: None Help from another person taking care of personal grooming?: None Help from another person toileting, which includes using toliet, bedpan, or urinal?: A Lot Help from another person bathing (including washing, rinsing, drying)?: A Lot Help from another person to put on and taking off regular upper body clothing?: A Little Help from another person to put on and taking off regular lower body clothing?: A Lot 6 Click Score: 17    End of Session Equipment Utilized During Treatment: Rolling walker (2 wheels)  OT Visit Diagnosis: Unsteadiness on feet (R26.81);Repeated falls (R29.6);Muscle weakness (generalized) (M62.81)   Activity Tolerance Patient tolerated treatment well   Patient Left in chair;with call bell/phone within reach;with chair alarm set   Nurse Communication Mobility status        Time: 1308-6578 OT Time Calculation (min): 24 min  Charges: OT General Charges $OT Visit: 1 Visit OT Treatments $Therapeutic Activity: 8-22 mins  Gerre Kraft, OTD OTR/L  04/10/24, 10:55 AM

## 2024-04-10 NOTE — Consult Note (Addendum)
 NAME: Samantha Burgess  DOB: Mar 28, 1961  MRN: [id number]  Date/Time: 04/10/2024 11:35 AM  REQUESTING PROVIDER: Dr.Wouk Subjective:  REASON FOR CONSULT: meningitis ? Samantha Burgess is a 63 y.o. female with a history of DM, diabetic retionpathy, vitrectomy rt 09/25/23. Pars plana vitrectomy  presented to the ED after witnessed seizure at home As per patient she was doing well on 5/202/5. In the morning she was cooking and cleaning and then washed her hair and was talking to her cousin when she started to become stiff and pointed upwards to call her daughter  who wa sin the bathroom- the cousin called daughter and patient then developed tonic clonic seizures- EMS was called and when they arrived pt was awake and was initially disoriented and became fully awake AAX4. Vitals recorded by EMS 178/ 111, pulse  115  and sats 94%  In the ED  04/07/24  BP 164/92 (H)  Temp 98.2 F (36.8 C)  Pulse Rate 110 !  Resp 20  SpO2 96 %     Latest Reference Range & Units 04/07/24  WBC 4.0 - 10.5 K/uL 6.8  Hemoglobin 12.0 - 15.0 g/dL 40.9  HCT 81.1 - 91.4 % 39.9  Platelets 150 - 400 K/uL 392  Creatinine 0.44 - 1.00 mg/dL 7.82 (H)    CT head N Blood sugar was 710 Neuro tele consult was obtained and they thought seizures provoked by hyperglycemia and concern for Todds paralysis Pt received fentanyl , ativan , morphine  and versed  and was in ICU. Was very encephalopathic,, confused On 5/21 she started having fever and started on antibiotics  for possible meningitis and encephalitis  She had very high BP the whole day of 04/08/24 with highest of 200/114  LP done yesterday was traumatic but still has abnormal cell count and I am asked to se eher for meningitis Pt is awake and alert today She knows that she was thrashing and confused and kicking everyone on Wednesday and yesterday She has some swellign hands, knees and says her teeth are broken from the grinding and thrashing She says she was not taking  insulin  for 2 days as she was waiting for mail order Says she was taking PO meds Says she was well until the seizure the night of 5/20 No fever, or chills, or cold, or head ache before No sick contact No travel Has 2 dogs at home   Past Medical History:  Diagnosis Date   Arthritis 2000   DM (diabetes mellitus) (HCC) 2005   GERD (gastroesophageal reflux disease)    HLD (hyperlipidemia)    HTN (hypertension) 2007   Obesity 1999   Sleep apnea 2007    Past Surgical History:  Procedure Laterality Date   CESAREAN SECTION     IR FLUORO GUIDE CV LINE RIGHT  05/10/2017   KNEE ARTHROSCOPY Right    UPPER GASTROINTESTINAL ENDOSCOPY      Social History   Socioeconomic History   Marital status: Single    Spouse name: Not on file   Number of children: 1   Years of education: Not on file   Highest education level: Not on file  Occupational History   Occupation: disabled  Tobacco Use   Smoking status: Never   Smokeless tobacco: Never  Substance and Sexual Activity   Alcohol use: No   Drug use: No   Sexual activity: Yes    Birth control/protection: Condom  Other Topics Concern   Not on file  Social History Narrative   Not on file  Social Drivers of Corporate investment banker Strain: Not on file  Food Insecurity: Patient Unable To Answer (04/08/2024)   Hunger Vital Sign    Worried About Running Out of Food in the Last Year: Patient unable to answer    Ran Out of Food in the Last Year: Patient unable to answer  Transportation Needs: Patient Unable To Answer (04/08/2024)   PRAPARE - Transportation    Lack of Transportation (Medical): Patient unable to answer    Lack of Transportation (Non-Medical): Patient unable to answer  Physical Activity: Not on file  Stress: Not on file  Social Connections: Unknown (04/03/2022)   Received from [facility]   Social Network    Social Network: Not on file  Intimate Partner Violence: Patient Unable To Answer (04/08/2024)    Humiliation, Afraid, Rape, and Kick questionnaire    Fear of Current or Ex-Partner: Patient unable to answer    Emotionally Abused: Patient unable to answer    Physically Abused: Patient unable to answer    Sexually Abused: Patient unable to answer    Family History  Problem Relation Age of Onset   Esophageal cancer Paternal Grandmother    Diabetes Mother    Diabetes Maternal Grandmother    Colon cancer Neg Hx    Rectal cancer Neg Hx    Stomach cancer Neg Hx    No Known Allergies I? Current Facility-Administered Medications  Medication Dose Route Frequency Provider Last Rate Last Admin   acetaminophen  (OFIRMEV ) IV 1,000 mg  1,000 mg Intravenous Q6H PRN Wouk, Haynes Lips, MD       acyclovir (ZOVIRAX) 750 mg in dextrose 5 % 250 mL IVPB  10 mg/kg Intravenous Q8H Nazari, Walid A, RPH 265 mL/hr at 04/10/24 0527 750 mg at 04/10/24 0527   ampicillin  (OMNIPEN) 2 g in sodium chloride  0.9 % 100 mL IVPB  2 g Intravenous Q4H Zeigler, Dustin G, RPH 300 mL/hr at 04/10/24 8657 2 g at 04/10/24 8469   cefTRIAXone (ROCEPHIN) 2 g in sodium chloride  0.9 % 100 mL IVPB  2 g Intravenous Q12H Keene, Jeremiah D, NP 200 mL/hr at 04/10/24 0956 2 g at 04/10/24 6295   Chlorhexidine  Gluconate Cloth 2 % PADS 6 each  6 each Topical Daily Mansy, Jan A, MD   6 each at 04/09/24 2329   dexamethasone (DECADRON) injection 10 mg  10 mg Intravenous Q6H Keene, Jeremiah D, NP   10 mg at 04/10/24 0526   dextrose 50 % solution 0-50 mL  0-50 mL Intravenous PRN Sung, Jade J, MD       dextrose 50 % solution            docusate sodium (COLACE) capsule 100 mg  100 mg Oral BID PRN Thomas, Sara-Maiz A, MD       insulin aspart (novoLOG) injection 0-20 Units  0-20 Units Subcutaneous Q4H Adelita Agar, NP   4 Units at 04/09/24 1954   insulin aspart (novoLOG) injection 4 Units  4 Units Subcutaneous Q4H Adelita Agar, NP   4 Units at 04/09/24 1954   ipratropium-albuterol  (DUONEB) 0.5-2.5 (3) MG/3ML nebulizer solution 3 mL  3 mL  Nebulization Q6H PRN Sabas Cradle, MD       ketorolac (TORADOL) 15 MG/ML injection 15 mg  15 mg Intravenous Q8H PRN Tera Fellows A, MD   15 mg at 04/09/24 1452   labetalol (NORMODYNE) injection 10 mg  10 mg Intravenous Q1H PRN Adelita Agar, NP   10 mg at 04/08/24  2121   levETIRAcetam (KEPPRA) undiluted injection 500 mg  500 mg Intravenous Q12H Stack, Colleen M, MD   500 mg at 04/10/24 0038   LORazepam (ATIVAN) injection 0.5 mg  0.5 mg Intravenous Q6H PRN Tera Fellows A, MD   0.5 mg at 04/09/24 0454   LORazepam (ATIVAN) injection 2 mg  2 mg Intravenous Q5 min PRN Sabas Cradle, MD   2 mg at 04/08/24 0945   nicardipine (CARDENE) 20mg  in 0.86% saline 200ml IV infusion (0.1 mg/ml)  3-15 mg/hr Intravenous Continuous Adelita Agar, NP   Stopped at 04/09/24 1421   ondansetron (ZOFRAN) injection 4 mg  4 mg Intravenous Q6H PRN Sabas Cradle, MD       oxyCODONE-acetaminophen  (PERCOCET/ROXICET) 5-325 MG per tablet 2 tablet  2 tablet Oral Q6H PRN Sabas Cradle, MD       pantoprazole (PROTONIX) injection 40 mg  40 mg Intravenous Q24H Wouk, Haynes Lips, MD   40 mg at 04/10/24 0920   polyethylene glycol (MIRALAX / GLYCOLAX) packet 17 g  17 g Oral Daily PRN Thomas, Sara-Maiz A, MD       potassium chloride 10 mEq in 100 mL IVPB  10 mEq Intravenous Q1 Hr x 2 Nazari, Walid A, RPH 100 mL/hr at 04/10/24 1042 10 mEq at 04/10/24 1042   rosuvastatin (CRESTOR) tablet 20 mg  20 mg Oral Daily Thomas, Sara-Maiz A, MD   20 mg at 04/10/24 0919   vancomycin (VANCOREADY) IVPB 750 mg/150 mL  750 mg Intravenous Q12H Nazari, Walid A, RPH 150 mL/hr at 04/10/24 0631 750 mg at 04/10/24 0631     Abtx:  Anti-infectives (From admission, onward)    Start     Dose/Rate Route Frequency Ordered Stop   04/09/24 1030  ampicillin  (OMNIPEN) 2 g in sodium chloride  0.9 % 100 mL IVPB        2 g 300 mL/hr over 20 Minutes Intravenous Every 4 hours 04/09/24 0915     04/09/24 0600  vancomycin (VANCOREADY)  IVPB 750 mg/150 mL        750 mg 150 mL/hr over 60 Minutes Intravenous Every 12 hours 04/08/24 1535     04/08/24 2200  cefTRIAXone (ROCEPHIN) 2 g in sodium chloride  0.9 % 100 mL IVPB        2 g 200 mL/hr over 30 Minutes Intravenous Every 12 hours 04/08/24 1525     04/08/24 1800  ampicillin  (OMNIPEN) 2 g in sodium chloride  0.9 % 100 mL IVPB  Status:  Discontinued        2 g 300 mL/hr over 20 Minutes Intravenous Every 6 hours 04/08/24 1525 04/09/24 0915   04/08/24 1700  vancomycin (VANCOREADY) IVPB 750 mg/150 mL  Status:  Discontinued        750 mg 150 mL/hr over 60 Minutes Intravenous Every 12 hours 04/08/24 1533 04/08/24 1535   04/08/24 1700  acyclovir (ZOVIRAX) 750 mg in dextrose 5 % 250 mL IVPB        10 mg/kg  75.1 kg 265 mL/hr over 60 Minutes Intravenous Every 8 hours 04/08/24 1542     04/08/24 1630  vancomycin (VANCOREADY) IVPB 1500 mg/300 mL        1,500 mg 150 mL/hr over 120 Minutes Intravenous  Once 04/08/24 1535 04/08/24 1913       REVIEW OF SYSTEMS:  Const: negative fever, negative chills, negative weight loss at home Eyes: negative diplopia or visual changes, negative eye pain ENT: negative coryza, negative sore throat Resp: negative  cough, hemoptysis, dyspnea Cards: negative for chest pain, palpitations, lower extremity edema GU: negative for frequency, dysuria and hematuria GI: Negative for abdominal pain, diarrhea, bleeding, constipation Skin: negative for rash and pruritus Heme: negative for easy bruising and gum/nose bleeding MS: now is c/o pain left knee Had a fall a few days ago Neurolo:+ headaches, no dizziness, vertigo, memory problems  Psych: negative for feelings of anxiety, depression  Endocrine: diabetes Allergy/Immunology-none Objective:  VITALS:  BP 124/79 (BP Location: Left Arm)   Pulse 93   Temp 97.9 F (36.6 C) (Axillary)   Resp 16   Ht 5\' 3"  (1.6 m)   Wt 80.4 kg   LMP 03/30/2013   SpO2 100%   BMI 31.40 kg/m   PHYSICAL EXAM:  General:  Alert, cooperative, no distress, appears stated age. Oriented X 4  Head: Normocephalic, without obvious abnormality, atraumatic. Eyes: Conjunctivae clear, anicteric sclerae. Pupils are equal ENT Nares normal. No drainage or sinus tenderness. Lips, mucosa, and tongue normal. No Thrush 2 broken teeth upper - but not sure this is new Neck: Supple, symmetrical, no adenopathy, thyroid: non tender no carotid bruit and no JVD. Back: No CVA tenderness. Lungs: Clear to auscultation bilaterally. No Wheezing or Rhonchi. No rales. Heart: Regular rate and rhythm, no murmur, rub or gallop. Abdomen: Soft, non-tender,not distended. Bowel sounds normal. No masses Extremities: atraumatic, no cyanosis. No edema. No clubbing Left knee no warmth Skin: No rashes or lesions. Or bruising Lymph: Cervical, supraclavicular normal. Neurologic: Grossly non-focal Pertinent Labs Lab Results CBC    Component Value Date/Time   WBC 10.8 (H) 04/10/2024 0507   RBC 4.10 04/10/2024 0507   HGB 11.8 (L) 04/10/2024 0507   HCT 35.1 (L) 04/10/2024 0507   PLT 342 04/10/2024 0507   MCV 85.6 04/10/2024 0507   MCH 28.8 04/10/2024 0507   MCHC 33.6 04/10/2024 0507   RDW 13.3 04/10/2024 0507   LYMPHSABS 2.2 04/07/2024 2208   MONOABS 0.6 04/07/2024 2208   EOSABS 0.2 04/07/2024 2208   BASOSABS 0.1 04/07/2024 2208       Latest Ref Rng & Units 04/10/2024    5:07 AM 04/09/2024    3:15 AM 04/08/2024    8:28 AM  CMP  Glucose 70 - 99 mg/dL 94  161  096   BUN 8 - 23 mg/dL 24  9  12    Creatinine 0.44 - 1.00 mg/dL 0.45  4.09  8.11   Sodium 135 - 145 mmol/L 138  133  133   Potassium 3.5 - 5.1 mmol/L 3.4  3.9  3.7   Chloride 98 - 111 mmol/L 107  99  98   CO2 22 - 32 mmol/L 24  22  22    Calcium 8.9 - 10.3 mg/dL 8.3  9.0  9.2   Total Protein 6.5 - 8.1 g/dL   7.3   Total Bilirubin 0.0 - 1.2 mg/dL   0.5   Alkaline Phos 38 - 126 U/L   128   AST 15 - 41 U/L   22   ALT 0 - 44 U/L   16       Microbiology: Recent Results (from  the past 240 hours)  MRSA Next Gen by PCR, Nasal     Status: None   Collection Time: 04/08/24  3:10 AM   Specimen: Nasal Mucosa; Nasal Swab  Result Value Ref Range Status   MRSA by PCR Next Gen NOT DETECTED NOT DETECTED Final    Comment: (NOTE) The GeneXpert MRSA Assay (FDA approved  for NASAL specimens only), is one component of a comprehensive MRSA colonization surveillance program. It is not intended to diagnose MRSA infection nor to guide or monitor treatment for MRSA infections. Test performance is not FDA approved in patients less than 22 years old. Performed at [facility], [address] [place], [place] [address]   Respiratory (~20 pathogens) panel by PCR     Status: None   Collection Time: 04/08/24  3:29 PM   Specimen: Nasopharyngeal Swab; Respiratory  Result Value Ref Range Status   Adenovirus NOT DETECTED NOT DETECTED Final   Coronavirus 229E NOT DETECTED NOT DETECTED Final    Comment: (NOTE) The Coronavirus on the Respiratory Panel, DOES NOT test for the novel  Coronavirus (2019 nCoV)    Coronavirus HKU1 NOT DETECTED NOT DETECTED Final   Coronavirus NL63 NOT DETECTED NOT DETECTED Final   Coronavirus OC43 NOT DETECTED NOT DETECTED Final   Metapneumovirus NOT DETECTED NOT DETECTED Final   Rhinovirus / Enterovirus NOT DETECTED NOT DETECTED Final   Influenza A NOT DETECTED NOT DETECTED Final   Influenza B NOT DETECTED NOT DETECTED Final   Parainfluenza Virus 1 NOT DETECTED NOT DETECTED Final   Parainfluenza Virus 2 NOT DETECTED NOT DETECTED Final   Parainfluenza Virus 3 NOT DETECTED NOT DETECTED Final   Parainfluenza Virus 4 NOT DETECTED NOT DETECTED Final   Respiratory Syncytial Virus NOT DETECTED NOT DETECTED Final   Bordetella pertussis NOT DETECTED NOT DETECTED Final   Bordetella Parapertussis NOT DETECTED NOT DETECTED Final   Chlamydophila pneumoniae NOT DETECTED NOT DETECTED Final   Mycoplasma pneumoniae NOT DETECTED NOT DETECTED Final    Comment:  Performed at [facility], [address], [place], [place] [address]  Culture, blood (Routine X 2) w Reflex to ID Panel     Status: None (Preliminary result)   Collection Time: 04/08/24  4:38 PM   Specimen: BLOOD  Result Value Ref Range Status   Specimen Description BLOOD BLOOD LEFT HAND  Final   Special Requests   Final    BOTTLES DRAWN AEROBIC AND ANAEROBIC Blood Culture results may not be optimal due to an inadequate volume of blood received in culture bottles   Culture   Final    NO GROWTH 2 DAYS Performed at [facility], [address], [place], [place] [address]    Report Status PENDING  Incomplete  Culture, blood (Routine X 2) w Reflex to ID Panel     Status: None (Preliminary result)   Collection Time: 04/08/24  4:38 PM   Specimen: BLOOD  Result Value Ref Range Status   Specimen Description BLOOD BLOOD RIGHT WRIST  Final   Special Requests   Final    BOTTLES DRAWN AEROBIC AND ANAEROBIC Blood Culture adequate volume   Culture   Final    NO GROWTH 2 DAYS Performed at [facility], [address] [place], [place] [address]    Report Status PENDING  Incomplete  CSF culture w Gram Stain     Status: None (Preliminary result)   Collection Time: 04/09/24  4:08 PM   Specimen: CSF; Cerebrospinal Fluid  Result Value Ref Range Status   Specimen Description   Final    CSF Performed at [facility], [address], [place], [place] [address]    Special Requests   Final    CSF Performed at [facility], [address] [place], [place] [address]    Gram Stain   Final  WBC SEEN RED BLOOD CELLS PRESENT NO ORGANISMS SEEN Performed at [facility], [address], [place], [place] [address]    Culture   Final    NO GROWTH < 24 HOURS Performed at [facility], [address] [place], [place] [address]    Report Status PENDING  Incomplete          Component Ref Range & Units (hover) 1 d ago  Glucose, CSF  184 High   Total  Protein, CSF 136 High       IMAGING RESULTS: CT head no acute findings I have personally reviewed the films ? Impression/Recommendation  Seizures This likely was due to the severe hyperglycemia and need to r/o cerebral vein thrombosis in such situations as risk for it Also could be due to malignant hypertension  Severe Encephalopathy  The D.D a) Medication induced as she received ativan, fentanyl, versed, morphine b) malignant HTN- hypertensive encephalopathy, PRES, CVT ( CT cerebral perfusion study wa sokay)   Fever could be because of seizure and excess thrashing and excess movt Will check CK Her history of sudden onset seizures and no other symptoms does not  favor for a meningitis or encephalitis process CSF pleocytosis high protein, ME panel negative  Traumatic LP but even after RBC/WBC correction the white cell is high Seizures, HTN encephalopathy, PRES can all affect BBB High protein in csf could be seen in traumatic LP, and also when the BBB is breached Pt is totally alert today and with no evidence of any neck stiffness . Will DC Acyclovir/ampicillin - as no suspicion for herpes encephalitis or listeria continue ceftriaxone Q 12  for now-  - Continue vanco If blood culture remains neg tomorrow then can Dc vanco DC decadron No need for droplet precaution  Left knee pain due to trauma CT shows patellar fracture  DM poorly controlled- Hyperosmolar hyperglycemic state Management as per primary team  Hypertensive emergency- now better controlled  H/o sinusitis and in March received Augmentin  for 7 days    ________________________________________________ Discussed with patient, and Dr.Wouk This consult involved complex antimicrobial management  ID will follow her peripherally this weekend On call ID available by phone for urgent issues only - call if needed

## 2024-04-10 NOTE — Consult Note (Signed)
 Pharmacy Antibiotic Note  Samantha Burgess is a 63 y.o. female admitted on 04/07/2024 with seizures. While in ICU, patient spiked fever of unknown origin. Pharmacy has been consulted for Vancomycin dosing for possible meningitis  Today,04/10/2024 Day # 2 antibiotics Renal; SCr WNL WBC 10.8 Tm 99.4 5/21 blood cx: NGTD 5/22 LP - bloody tap 4th bottle: RBC 2164, WBC 283 (predominately neutrophils), protein 136 Meningitis-encephalitis panel: neg 5/22 CSF culture: NGTD  Vancomycin levels: On vancomycin 750mg  IV q12h, last dose given at 0630 on 5/23 Vancomycin trough 5/23 at _1734__ = _13_ mcg/ml.  Prior to 4th maintenance dose (5th overall dose if include loading dose)  Plan: Will increase Vancomycin from 750 mg to 1000 mg IV Q 12 hrs.  (Vancomycin trough goal 15-20).  Do not use AUC as goal for CNS infection SCr used: 0.8(actual 0.74), Vd used: 0.72 Also on ceftriaxone 2gm IV q12h Follow renal function Check trough today 5/23 ID consulted today. May stop vancomycin in net 24-48h in CSF culture no growth    Height: 5\' 3"  (160 cm) Weight: 80.4 kg (177 lb 4 oz) IBW/kg (Calculated) : 52.4  Temp (24hrs), Avg:98.5 F (36.9 C), Min:97.9 F (36.6 C), Max:99.4 F (37.4 C)  Recent Labs  Lab 04/07/24 2208 04/08/24 0828 04/08/24 0836 04/08/24 1056 04/09/24 0315 04/09/24 1053 04/09/24 1326 04/10/24 0507 04/10/24 1734  WBC 6.8  --  11.5*  --  11.5*  --   --  10.8*  --   CREATININE 1.18* 0.74  --   --  0.76  --   --  0.92  --   LATICACIDVEN  --  2.8*  --  2.4*  --  1.3 1.5  --   --   VANCOTROUGH  --   --   --   --   --   --   --   --  13*    Estimated Creatinine Clearance: 63.7 mL/min (by C-G formula based on SCr of 0.92 mg/dL).    No Known Allergies  Antimicrobials this admission: Vancomycin 5/21 >>  Acyclovir 5/21 >>5/23 Ceftriaxone 5/21 >>  Ampicillin  5/21 >>5/23  Thank you for allowing pharmacy to be a part of this patient's care.  Thomasine Flick PharmD Clinical  Pharmacist 04/10/2024

## 2024-04-10 NOTE — TOC Initial Note (Signed)
 Transition of Care [facility]) - Initial/Assessment Note    Patient Details  Name: Samantha Burgess MRN: [id number] Date of Birth: 1961/03/29  Transition of [facility]) CM/SW Contact:    Kaniah Rizzolo A Lumina Gitto, RN Phone Number: 04/10/2024, 3:41 PM  Clinical Narrative:                 Chart reviewed.  Noted that patient was admitted with seizures.  Noted that Neurology following patient.  Noted ID also consulted.  Patient has received Spinal Tap.  Patient is on Empiric Acyclofir, ceftriaxone, vancomycin, and dexamethasone.    I have meet with patient at bedside.  She reports that prior to admission she lived at home with her daughter and son.  She reports that prior to admission she was able to get around without any assistive device.  She informs me that she would use a cane if she had to walk long distances.  She reports that she was able to bath and dress herself.  She also informs me that she was able to drive prior to admission.  She informs me that medications where affordable.  [facility] [place].    I have informed her that PT has recommended home health on discharge.  Samantha Burgess did not have a home health preference.  I have asked [facility] to accept Home Health for patient. [place]  with [facility] has accepted home health referral for home health PT.    TOC will continue to follow for discharge planning.        Expected Discharge Plan: Home w Home Health Services Barriers to Discharge: Continued Medical Work up   Patient Goals and CMS Choice   CMS Medicare.gov Compare Post Acute Care list provided to:: Patient (Patient has no home health preference.) Choice offered to / list presented to : Patient [place] ownership interest in [facility].provided to:: Sibling    Expected Discharge Plan and Services   Discharge Planning Services: CM Consult Post Acute Care Choice: Home Health Living arrangements for the past 2 months: Single Family Home                                       Prior Living Arrangements/Services Living arrangements for the past 2 months: Single Family Home Lives with:: Adult Children (Patient lives with her daughter and grandson.) Patient language and need for interpreter reviewed:: Yes Do you feel safe going back to the place where you live?: Yes        Care giver support system in place?: Yes (comment) (Patient has a supportive daughter) Current home services:  (Patient reports that she has a cane at home.)    Activities of Daily Living   ADL Screening (condition at time of admission) Independently performs ADLs?: Yes (appropriate for developmental age) Is the patient deaf or have difficulty hearing?: No Does the patient have difficulty seeing, even when wearing glasses/contacts?: No Does the patient have difficulty concentrating, remembering, or making decisions?: No  Permission Sought/Granted                  Emotional Assessment Appearance:: Appears stated age Attitude/Demeanor/Rapport: Engaged Affect (typically observed): Appropriate Orientation: : Oriented to Self, Oriented to Place, Oriented to  Time, Oriented to Situation      Admission diagnosis:  Seizure (HCC) [R56.9] Hypertension, unspecified type [I10] Type 2 diabetes mellitus with hyperosmolar hyperglycemic state (HHS) (HCC) [E11.00]  Patient Active Problem List   Diagnosis Date Noted   Obesity (BMI 30-39.9) 04/09/2024   Seizure (HCC) 04/08/2024   Essential hypertension 12/23/2017   Type 2 diabetes mellitus with stage 2 chronic kidney disease, with long-term current use of insulin (HCC) 05/16/2016   Dyspepsia and other specified disorders of function of stomach 03/18/2013   Dysphagia 03/18/2013   PCP:  Ladonna Pickup, MD Pharmacy:   [id number] [place], [place] - [address] [place] [address] Phone: [phone] Fax: [phone]  [id number] [place],  [place] - [address] [place] [address] Phone: [phone] Fax: [phone]     Social Drivers of Health (SDOH) Social History: SDOH Screenings   Food Insecurity: Patient Unable To Answer (04/08/2024)  Housing: Patient Unable To Answer (04/08/2024)  Transportation Needs: Patient Unable To Answer (04/08/2024)  Utilities: Patient Unable To Answer (04/08/2024)  Social Connections: Unknown (04/03/2022)   [facility]  Tobacco Use: Low Risk  (04/07/2024)   SDOH Interventions:     Readmission Risk Interventions     No data to display

## 2024-04-10 NOTE — Consult Note (Signed)
 Pharmacy Antibiotic Note  Samantha Burgess is a 63 y.o. female admitted on 04/07/2024 with seizures. While in ICU, patient spiked fever of unknown origin. Pharmacy has been consulted for Vancomycin dosing for possible meningitis  Today,04/10/2024 Day # 2 antibiotics Renal; SCr WNL WBC 10.8 Tm 99.4 5/21 blood cx: NGTD 5/22 LP - bloody tap 4th bottle: RBC 2164, WBC 283 (predominately neutrophils), protein 136 Meningitis-encephalitis panel: neg 5/22 CSF culture: NGTD  Vancomycin levels: On vancomycin 750mg  IV q12h, last dose given at 0630 on 5/23 Vancomycin trough 5/23 at ___ = __ mcg/ml.  Prior to 4th maintenance dose (5th overall dose if include loading dose)  Plan: Vancomycin 750 mg IV Q 12 hrs.  Expected Cmin: 15.3  (goal 15-20).  Do not use AUC as goal for CNS infection SCr used: 0.8(actual 0.74), Vd used: 0.72 Also on ceftriaxone 2gm IV q12h Follow renal function Check trough today ID consulted today. May stop vancomycin in net 24-48h in CSF culture no growth    Height: 5\' 3"  (160 cm) Weight: 80.4 kg (177 lb 4 oz) IBW/kg (Calculated) : 52.4  Temp (24hrs), Avg:98.7 F (37.1 C), Min:97.9 F (36.6 C), Max:99.4 F (37.4 C)  Recent Labs  Lab 04/07/24 2208 04/08/24 0828 04/08/24 0836 04/08/24 1056 04/09/24 0315 04/09/24 1053 04/09/24 1326 04/10/24 0507  WBC 6.8  --  11.5*  --  11.5*  --   --  10.8*  CREATININE 1.18* 0.74  --   --  0.76  --   --  0.92  LATICACIDVEN  --  2.8*  --  2.4*  --  1.3 1.5  --     Estimated Creatinine Clearance: 63.7 mL/min (by C-G formula based on SCr of 0.92 mg/dL).    No Known Allergies  Antimicrobials this admission: Vancomycin 5/21 >>  Acyclovir 5/21 >>5/23 Ceftriaxone 5/21 >>  Ampicillin  5/21 >>5/23  Thank you for allowing pharmacy to be a part of this patient's care.  Shayden Gingrich, PharmD, BCPS, BCIDP Work Cell: [phone] 04/10/2024 4:35 PM

## 2024-04-10 NOTE — Progress Notes (Signed)
 Physical Therapy Treatment Patient Details Name: Samantha Burgess MRN: [id number] DOB: Sep 07, 1961 Today's Date: 04/10/2024   History of Present Illness Samantha Burgess is a 63 y.o. 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 Patient presents to  s/ witnessed tonic clonic seizure at home. Per EMS patient was post ictal on arrival. In the field patient CBG was 127.    PT Comments  Patient received in bed, she is much more alert and oriented today. Able to remember what brought her here. She is agreeable to getting up out of bed. Patient is cga for bed mobility. Transfers with min A. Unsteady with initial ambulation without AD. Transferred to recliner. Then ambulated 8 feet to door with RW and min +2 assist. Patient continued to feel off and unsteady even with RW. Patient reports pain in left knee with mobility, however does not look swollen to me at this time. She does have some swelling in L hand.  She will continue to benefit from skilled PT to improve independence, safety and strength.      If plan is discharge home, recommend the following: A little help with bathing/dressing/bathroom;Assist for transportation;Help with stairs or ramp for entrance;A lot of help with walking and/or transfers;Assistance with cooking/housework   Can travel by private vehicle     Yes  Equipment Recommendations  Rolling walker (2 wheels)    Recommendations for Other Services       Precautions / Restrictions Precautions Precautions: Fall Recall of Precautions/Restrictions: Impaired Restrictions Weight Bearing Restrictions Per Provider Order: No     Mobility  Bed Mobility Overal bed mobility: Needs Assistance Bed Mobility: Supine to Sit     Supine to sit: Contact guard, HOB elevated, Used rails          Transfers Overall transfer level:  Needs assistance Equipment used: Rolling walker (2 wheels), None Transfers: Sit to/from Stand Sit to Stand: Min assist, Contact guard assist           General transfer comment: CGA with RW, MIN A with no AD; intermittent vcs for technique. Unsteady with mobility    Ambulation/Gait Ambulation/Gait assistance: Contact guard assist Gait Distance (Feet): 8 Feet Assistive device: Rolling walker (2 wheels) Gait Pattern/deviations: Step-through pattern, Decreased step length - right, Decreased step length - left, Decreased stride length Gait velocity: decr     General Gait Details: patient ambulated to recliner from bed without AD, min unsteadiness. Then ambulated to door with RW and +2 min A. Patient is slightly unsteady. Benefits from RW at this time. Feels strange/dizzy and reports L knee pain with mobility.   Stairs             Wheelchair Mobility     Tilt Bed    Modified Rankin (Stroke Patients Only)       Balance Overall balance assessment: Needs assistance Sitting-balance support: Feet supported Sitting balance-Leahy Scale: Good     Standing balance support: Bilateral upper extremity supported, During functional activity, Reliant on assistive device for balance Standing balance-Leahy Scale: Fair Standing balance comment: unsteady without AD. Slight unsteadiness with RW.                            Communication Communication Communication: No apparent difficulties  Cognition Arousal: Alert Behavior During Therapy: WFL for tasks assessed/performed   PT -  Cognitive impairments: No apparent impairments                       PT - Cognition Comments: Patient alert and oriented this session Following commands: Impaired Following commands impaired: Follows one step commands with increased time    Cueing Cueing Techniques: Verbal cues, Gestural cues  Exercises      General Comments General comments (skin integrity, edema, etc.): vss  throughout      Pertinent Vitals/Pain Pain Assessment Pain Assessment: Faces Faces Pain Scale: Hurts a little bit Pain Location: L knee and L hand Pain Descriptors / Indicators: Discomfort, Sore Pain Intervention(s): Monitored during session, Repositioned    Home Living Family/patient expects to be discharged to:: Private residence Living Arrangements: Children Available Help at Discharge: Family (daugther is currently on mat leave) Type of Home: Other(Comment) (townhouse) Home Access: Level entry     Alternate Level Stairs-Number of Steps: 12 Home Layout: Multi-level;Able to live on main level with bedroom/bathroom;1/2 bath on main level Home Equipment: Cane - single point      Prior Function            PT Goals (current goals can now be found in the care plan section) Acute Rehab PT Goals Patient Stated Goal: return home PT Goal Formulation: With patient Time For Goal Achievement: 04/23/24 Progress towards PT goals: Progressing toward goals    Frequency    Min 2X/week      PT Plan      Co-evaluation PT/OT/SLP Co-Evaluation/Treatment: Yes Reason for Co-Treatment: To address functional/ADL transfers;Necessary to address cognition/behavior during functional activity;For patient/therapist safety PT goals addressed during session: Mobility/safety with mobility;Balance;Proper use of DME OT goals addressed during session: ADL's and self-care      AM-PAC PT "6 Clicks" Mobility   Outcome Measure  Help needed turning from your back to your side while in a flat bed without using bedrails?: A Little Help needed moving from lying on your back to sitting on the side of a flat bed without using bedrails?: A Little Help needed moving to and from a bed to a chair (including a wheelchair)?: A Little Help needed standing up from a chair using your arms (e.g., wheelchair or bedside chair)?: A Little Help needed to walk in hospital room?: A Little Help needed climbing 3-5  steps with a railing? : A Lot 6 Click Score: 17    End of Session   Activity Tolerance: Patient limited by fatigue;Patient limited by pain Patient left: in chair;with call bell/phone within reach;with chair alarm set Nurse Communication: Mobility status PT Visit Diagnosis: Other abnormalities of gait and mobility (R26.89);Muscle weakness (generalized) (M62.81);Difficulty in walking, not elsewhere classified (R26.2);Pain Pain - Right/Left: Left Pain - part of body: Knee     Time: 1610-9604 PT Time Calculation (min) (ACUTE ONLY): 24 min  Charges:    $Gait Training: 8-22 mins PT General Charges $$ ACUTE PT VISIT: 1 Visit                     Tom Macpherson, PT, GCS 04/10/24,11:19 AM

## 2024-04-10 NOTE — Evaluation (Signed)
 Clinical/Bedside Swallow Evaluation Patient Details  Name: Samantha Burgess MRN: [id number] Date of Birth: 03-20-1961  Today's Date: 04/10/2024 Time: SLP Start Time (ACUTE ONLY): 0851 SLP Stop Time (ACUTE ONLY): 0900 SLP Time Calculation (min) (ACUTE ONLY): 9 min  Past Medical History:  Past Medical History:  Diagnosis Date   Arthritis 2000   DM (diabetes mellitus) (HCC) 2005   GERD (gastroesophageal reflux disease)    HLD (hyperlipidemia)    HTN (hypertension) 2007   Obesity 1999   Sleep apnea 2007   Past Surgical History:  Past Surgical History:  Procedure Laterality Date   CESAREAN SECTION     IR FLUORO GUIDE CV LINE RIGHT  05/10/2017   KNEE ARTHROSCOPY Right    UPPER GASTROINTESTINAL ENDOSCOPY     HPI:  Samantha Burgess is a 63 y.o. 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 Patient presents to  s/ witnessed tonic clonic seizure at home. Per EMS patient was post ictal on arrival. In the field patient CBG was 127. LKW 9pm.Patient transported to ED for further care. Per family patient did not have any bowel or bladder incontinence and had LOC for around 30 sec per chart. MRI negative, EEG negative.    Assessment / Plan / Recommendation  Clinical Impression  Pt presents with remarkable improvement in cognitive abilities and is ready for PO trials. She demonstrated adequate oropharyngeal abilities when consuming ice chips, thin liquids via straw, puree and graham crackers. Pt was observed with active mastication and complete oral clearing following all boluses as well as the appearance of a swift swallow response. Pt's vitals remained stable throughout.   At this time, pt's risk of aspiration appears reduced when following general aspiration precautions while consuming a regular diet with thin liquids (may use straw) and  medicine whole with thin liquids. ST services for dysphagia are not indicated. SLP Visit Diagnosis: Cognitive communication deficit (R41.841);Dysphagia, unspecified (R13.10)    Aspiration Risk  No limitations    Diet Recommendation Regular;Thin liquid    Liquid Administration via: Straw;Cup Medication Administration: Whole meds with liquid Supervision: Patient able to self feed;Intermittent supervision to cue for compensatory strategies Compensations: Minimize environmental distractions;Slow rate;Small sips/bites Postural Changes: Seated upright at 90 degrees;Remain upright for at least 30 minutes after po intake    Other  Recommendations Oral Care Recommendations: Oral care BID    Recommendations for follow up therapy are one component of a multi-disciplinary discharge planning process, led by the attending physician.  Recommendations may be updated based on patient status, additional functional criteria and insurance authorization.  Follow up Recommendations No SLP follow up         Functional Status Assessment Patient has not had a recent decline in their functional status       Swallow Study   General Date of Onset: 04/07/24 HPI: Samantha Burgess is a 63 y.o. 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 Patient presents to  s/ witnessed tonic clonic seizure at home. Per EMS patient was post ictal on arrival. In the field patient CBG was 127. LKW 9pm.Patient transported to ED for further care. Per family patient did not have any bowel or bladder incontinence and had LOC for around 30 sec  per chart. MRI negative, EEG negative. Type of Study: Bedside Swallow Evaluation Previous Swallow Assessment: none in chart Diet Prior to this Study: NPO Temperature Spikes Noted: Yes Respiratory Status: Room air History of Recent  Intubation: No Behavior/Cognition: Alert;Cooperative;Pleasant mood Oral Cavity Assessment: Within Functional Limits Oral Care Completed by SLP: Recent completion by staff Oral Cavity - Dentition: Adequate natural dentition Vision: Functional for self-feeding Self-Feeding Abilities: Able to feed self Patient Positioning: Upright in bed Baseline Vocal Quality: Normal Volitional Cough: Strong Volitional Swallow: Able to elicit    Oral/Motor/Sensory Function Overall Oral Motor/Sensory Function: Within functional limits   Ice Chips Ice chips: Within functional limits Presentation: Spoon   Thin Liquid Thin Liquid: Within functional limits Presentation: Spoon;Straw;Self Fed    Nectar Thick Nectar Thick Liquid: Not tested   Honey Thick Honey Thick Liquid: Not tested   Puree Puree: Within functional limits Presentation: Spoon   Solid     Solid: Within functional limits      Samantha Burgess B. Garlin Junker, M.S., CCC-SLP, Tree surgeon Certified Brain Injury Specialist [facility]  [facility] Office [phone] Ascom [phone] Fax [phone]

## 2024-04-10 NOTE — Progress Notes (Signed)
 PROGRESS NOTE    Samantha [facility]  JYN:[id number] DOB: 11-26-1960 DOA: 04/07/2024 PCP: Ladonna Pickup, MD  Outpatient Specialists: none    Brief Narrative:   From admission h and p  Samantha [facility] is a 63 y.o. 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 Patient presents to  s/ witnessed tonic clonic seizure at home. Per EMS patient was post ictal on arrival. In the field patient CBG was 127. LKW 9pm.Patient transported to ED for further care. Per family patient did not have any bowel or bladder incontinence and had LOC for around 30 sec per chart. Patient currently s/p ativan given for agitation. Patient not able to give history at this time and history is taken from chart as patient is sedated.    Assessment & Plan:   Principal Problem:   Seizure (HCC) Active Problems:   Essential hypertension   Type 2 diabetes mellitus with stage 2 chronic kidney disease, with long-term current use of insulin (HCC)   Obesity (BMI 30-39.9)  # HHS # T2DM, uncontrolled Much improved, now tolerating po. A1c > 15.5. - d/c fluids - mealtime - may resume basal  # Seizure? EEG done but no result yet. Encephalopathy resolved. MRI motion degraded but negative, CTA negative. - neurology to see today - eeg results pending - keppra per neuro  # Fever # Meningitis? # SIRS # Left knee pain/swelling Concern for meningitis. Respiratory panels negative. Cxr clear. Traumatic tap, meningitis pcr negative. Only complaint today is left knee pain, it is warm and somewhat swollen. Daughter says she fell few weeks ago and injured it - monitor cultures - ID to see - on empiric acyclofir, ceftriaxone, vancomycin, ampicillin , dexamethasone - CT left knee  # Hypertensive urgency/emergency MRI nothing acute. BP has normalized - d/c nicardipine -  resume home ramipril, hctz  # Obesity Noted  # Chronic pain - resume home oxy  # Debility PT advises home health   DVT prophylaxis: lovenox Code Status: full Family Communication: daughter Read Camel updated telephonically 5/22  Level of care: Stepdown Status is: Inpatient Remains inpatient appropriate because: severity of illness    Consultants:  Pccm, neurology, infectious disease  Procedures: LP  Antimicrobials:  See above    Subjective: Feeling well, has left knee pain  Objective: Vitals:   04/10/24 0500 04/10/24 0600 04/10/24 0700 04/10/24 0800  BP: 134/87 125/77 (!) 142/83 124/79  Pulse: 88 85 92 93  Resp: (!) 21 20 16 16   Temp:    97.9 F (36.6 C)  TempSrc:    Axillary  SpO2: 98% 100% 100% 100%  Weight: 80.4 kg     Height:        Intake/Output Summary (Last 24 hours) at 04/10/2024 1201 Last data filed at 04/10/2024 0600 Gross per 24 hour  Intake 2164.5 ml  Output 725 ml  Net 1439.5 ml   Filed Weights   04/08/24 0330 04/09/24 0500 04/10/24 0500  Weight: 75.1 kg 80.4 kg 80.4 kg    Examination:  General exam: Appears ill Respiratory system: Clear to auscultation. Respiratory effort normal. Cardiovascular system: S1 & S2 heard, tachycardic Gastrointestinal system: Abdomen is obese, soft and nontender.   Central nervous system: moving all 4, doesn't follow commands Extremities: mild effusion left knee which is warm Skin: No rashes, lesions or ulcers Psychiatry: altered     Data Reviewed: I have  personally reviewed following labs and imaging studies  CBC: Recent Labs  Lab 04/07/24 2208 04/08/24 0836 04/09/24 0315 04/10/24 0507  WBC 6.8 11.5* 11.5* 10.8*  NEUTROABS 3.7  --   --   --   HGB 13.1 13.2 14.8 11.8*  HCT 39.9 40.1 43.8 35.1*  MCV 85.4 85.0 84.2 85.6  PLT 392 402* 401* 342   Basic Metabolic Panel: Recent Labs  Lab 04/07/24 2208 04/08/24 0828 04/09/24 0315 04/10/24 0507  NA 128* 133* 133* 138  K 3.7 3.7 3.9 3.4*  CL  91* 98 99 107  CO2 22 22 22 24   GLUCOSE 710* 269* 294* 94  BUN 18 12 9  24*  CREATININE 1.18* 0.74 0.76 0.92  CALCIUM 9.3 9.2 9.0 8.3*  MG  --  1.7 2.0 2.0  PHOS  --  2.1* 2.8 4.2   GFR: Estimated Creatinine Clearance: 63.7 mL/min (by C-G formula based on SCr of 0.92 mg/dL). Liver Function Tests: Recent Labs  Lab 04/08/24 0828  AST 22  ALT 16  ALKPHOS 128*  BILITOT 0.5  PROT 7.3  ALBUMIN 3.9   No results for input(s): "LIPASE", "AMYLASE" in the last 168 hours. No results for input(s): "AMMONIA" in the last 168 hours. Coagulation Profile: Recent Labs  Lab 04/07/24 2208  INR 1.0   Cardiac Enzymes: No results for input(s): "CKTOTAL", "CKMB", "CKMBINDEX", "TROPONINI" in the last 168 hours. BNP (last 3 results) No results for input(s): "PROBNP" in the last 8760 hours. HbA1C: Recent Labs    04/08/24 0828  HGBA1C >15.5*   CBG: Recent Labs  Lab 04/09/24 2325 04/10/24 0329 04/10/24 0332 04/10/24 0745 04/10/24 1106  GLUCAP 90 56* 60* 106* 137*   Lipid Profile: Recent Labs    04/08/24 0828  CHOL 203*  HDL 55  LDLCALC 112*  TRIG 178*  CHOLHDL 3.7   Thyroid Function Tests: No results for input(s): "TSH", "T4TOTAL", "FREET4", "T3FREE", "THYROIDAB" in the last 72 hours. Anemia Panel: No results for input(s): "VITAMINB12", "FOLATE", "FERRITIN", "TIBC", "IRON", "RETICCTPCT" in the last 72 hours. Urine analysis:    Component Value Date/Time   COLORURINE STRAW (A) 04/08/2024 1258   APPEARANCEUR CLEAR (A) 04/08/2024 1258   LABSPEC 1.025 04/08/2024 1258   PHURINE 7.0 04/08/2024 1258   GLUCOSEU >=500 (A) 04/08/2024 1258   HGBUR NEGATIVE 04/08/2024 1258   BILIRUBINUR NEGATIVE 04/08/2024 1258   KETONESUR 20 (A) 04/08/2024 1258   PROTEINUR >=300 (A) 04/08/2024 1258   NITRITE NEGATIVE 04/08/2024 1258   LEUKOCYTESUR NEGATIVE 04/08/2024 1258   Sepsis Labs: @LABRCNTIP (procalcitonin:4,lacticidven:4)  ) Recent Results (from the past 240 hours)  MRSA Next Gen by  PCR, Nasal     Status: None   Collection Time: 04/08/24  3:10 AM   Specimen: Nasal Mucosa; Nasal Swab  Result Value Ref Range Status   MRSA by PCR Next Gen NOT DETECTED NOT DETECTED Final    Comment: (NOTE) The GeneXpert MRSA Assay (FDA approved for NASAL specimens only), is one component of a comprehensive MRSA colonization surveillance program. It is not intended to diagnose MRSA infection nor to guide or monitor treatment for MRSA infections. Test performance is not FDA approved in patients less than 26 years old. Performed at [facility], [address] [place], [place] [address]   Respiratory (~20 pathogens) panel by PCR     Status: None   Collection Time: 04/08/24  3:29 PM   Specimen: Nasopharyngeal Swab; Respiratory  Result Value Ref Range Status   Adenovirus NOT DETECTED NOT DETECTED Final  Coronavirus 229E NOT DETECTED NOT DETECTED Final    Comment: (NOTE) The Coronavirus on the Respiratory Panel, DOES NOT test for the novel  Coronavirus (2019 nCoV)    Coronavirus HKU1 NOT DETECTED NOT DETECTED Final   Coronavirus NL63 NOT DETECTED NOT DETECTED Final   Coronavirus OC43 NOT DETECTED NOT DETECTED Final   Metapneumovirus NOT DETECTED NOT DETECTED Final   Rhinovirus / Enterovirus NOT DETECTED NOT DETECTED Final   Influenza A NOT DETECTED NOT DETECTED Final   Influenza B NOT DETECTED NOT DETECTED Final   Parainfluenza Virus 1 NOT DETECTED NOT DETECTED Final   Parainfluenza Virus 2 NOT DETECTED NOT DETECTED Final   Parainfluenza Virus 3 NOT DETECTED NOT DETECTED Final   Parainfluenza Virus 4 NOT DETECTED NOT DETECTED Final   Respiratory Syncytial Virus NOT DETECTED NOT DETECTED Final   Bordetella pertussis NOT DETECTED NOT DETECTED Final   Bordetella Parapertussis NOT DETECTED NOT DETECTED Final   Chlamydophila pneumoniae NOT DETECTED NOT DETECTED Final   Mycoplasma pneumoniae NOT DETECTED NOT DETECTED Final    Comment: Performed at [facility],  [address] [place], [place] [address]  Culture, blood (Routine X 2) w Reflex to ID Panel     Status: None (Preliminary result)   Collection Time: 04/08/24  4:38 PM   Specimen: BLOOD  Result Value Ref Range Status   Specimen Description BLOOD BLOOD LEFT HAND  Final   Special Requests   Final    BOTTLES DRAWN AEROBIC AND ANAEROBIC Blood Culture results may not be optimal due to an inadequate volume of blood received in culture bottles   Culture   Final    NO GROWTH 2 DAYS Performed at [facility], [address], [place], [place] [address]    Report Status PENDING  Incomplete  Culture, blood (Routine X 2) w Reflex to ID Panel     Status: None (Preliminary result)   Collection Time: 04/08/24  4:38 PM   Specimen: BLOOD  Result Value Ref Range Status   Specimen Description BLOOD BLOOD RIGHT WRIST  Final   Special Requests   Final    BOTTLES DRAWN AEROBIC AND ANAEROBIC Blood Culture adequate volume   Culture   Final    NO GROWTH 2 DAYS Performed at [facility], [address], [place], [place] [address]    Report Status PENDING  Incomplete  CSF culture w Gram Stain     Status: None (Preliminary result)   Collection Time: 04/09/24  4:08 PM   Specimen: CSF; Cerebrospinal Fluid  Result Value Ref Range Status   Specimen Description   Final    CSF Performed at [facility], [address] [place], [place] [address]    Special Requests   Final    CSF Performed at [facility], [address] [place], [place] [address]    Gram Stain   Final    WBC SEEN RED BLOOD CELLS PRESENT NO ORGANISMS SEEN Performed at [facility], [address] [place], [place] [address]    Culture   Final    NO GROWTH < 24 HOURS Performed at [facility], [address], [place], [place] [address]    Report Status PENDING  Incomplete         Radiology Studies: ECHOCARDIOGRAM COMPLETE Result Date: 04/09/2024    ECHOCARDIOGRAM REPORT    Patient Name:   Samantha [facility]ess Date of Exam: 04/08/2024 Medical Rec #:  [id number]  Height:       63.0 in Accession #:    [id number]       Weight:       165.6 lb Date of Birth:  Oct 29, 1961        BSA:          1.784 m Patient Age:    63 years         BP:           162/99 mmHg Patient Gender: F                HR:           116 bpm. Exam Location:  [facility] Procedure: 2D Echo, Cardiac Doppler and Color Doppler (Both Spectral and Color            Flow Doppler were utilized during procedure). Indications:     G45.9 TIA  History:         Patient has no prior history of Echocardiogram examinations.                  Risk Factors:Hypertension, Diabetes, Dyslipidemia and Sleep                  Apnea.  Sonographer:     Brigid Canada RDCS Referring Phys:  [id number] Wiliam Harder A THOMAS Diagnosing Phys: Antonette Batters MD IMPRESSIONS  1. Left ventricular ejection fraction, by estimation, is 55 to 60%. The left ventricle has normal function. The left ventricle has no regional wall motion abnormalities. Left ventricular diastolic parameters are consistent with Grade I diastolic dysfunction (impaired relaxation).  2. Right ventricular systolic function is normal. The right ventricular size is normal.  3. The mitral valve is normal in structure. Trivial mitral valve regurgitation.  4. The aortic valve is normal in structure. Aortic valve regurgitation is not visualized. FINDINGS  Left Ventricle: Left ventricular ejection fraction, by estimation, is 55 to 60%. The left ventricle has normal function. The left ventricle has no regional wall motion abnormalities. Strain was performed and the global longitudinal strain is indeterminate. Global longitudinal strain performed but not reported based on interpreter judgement due to suboptimal tracking. The left ventricular internal cavity size was normal in size. There is no left ventricular hypertrophy. Left ventricular diastolic parameters are consistent with Grade I diastolic  dysfunction (impaired relaxation). Right Ventricle: The right ventricular size is normal. No increase in right ventricular wall thickness. Right ventricular systolic function is normal. Left Atrium: Left atrial size was normal in size. Right Atrium: Right atrial size was normal in size. Pericardium: There is no evidence of pericardial effusion. Mitral Valve: The mitral valve is normal in structure. Trivial mitral valve regurgitation. Tricuspid Valve: The tricuspid valve is normal in structure. Tricuspid valve regurgitation is trivial. Aortic Valve: The aortic valve is normal in structure. Aortic valve regurgitation is not visualized. Pulmonic Valve: The pulmonic valve was normal in structure. Pulmonic valve regurgitation is not visualized. Aorta: The ascending aorta was not well visualized. IAS/Shunts: No atrial level shunt detected by color flow Doppler. Additional Comments: 3D was performed not requiring image post processing on an independent workstation and was indeterminate.  LEFT VENTRICLE PLAX 2D LVIDd:         4.50 cm   Diastology LVIDs:         3.20 cm   LV e' medial:    6.96 cm/s LV PW:         1.00 cm   LV E/e' medial:  13.3 LV  IVS:        1.30 cm   LV e' lateral:   9.78 cm/s LVOT diam:     2.20 cm   LV E/e' lateral: 9.5 LV SV:         71 LV SV Index:   40 LVOT Area:     3.80 cm  RIGHT VENTRICLE             IVC RV Basal diam:  3.70 cm     IVC diam: 1.20 cm RV S prime:     17.25 cm/s TAPSE (M-mode): 2.6 cm LEFT ATRIUM             Index        RIGHT ATRIUM           Index LA diam:        4.70 cm 2.63 cm/m   RA Area:     10.50 cm LA Vol (A2C):   50.4 ml 28.24 ml/m  RA Volume:   23.40 ml  13.11 ml/m LA Vol (A4C):   33.1 ml 18.55 ml/m LA Biplane Vol: 43.7 ml 24.49 ml/m  AORTIC VALVE LVOT Vmax:   138.33 cm/s LVOT Vmean:  93.667 cm/s LVOT VTI:    0.188 m  AORTA Ao Root diam: 3.30 cm MITRAL VALVE MV Area (PHT): 5.66 cm     SHUNTS MV Decel Time: 134 msec     Systemic VTI:  0.19 m MV E velocity: 92.55 cm/s    Systemic Diam: 2.20 cm MV A velocity: 141.50 cm/s MV E/A ratio:  0.65 Dwayne D Callwood MD Electronically signed by Antonette Batters MD Signature Date/Time: 04/09/2024/1:21:27 PM    Final    DG FL GUIDED LUMBAR PUNCTURE Result Date: 04/09/2024 CLINICAL DATA:  63 year old female with new onset seizures, altered mental status. IR was requested for diagnostic lumbar puncture. EXAM: LUMBAR PUNCTURE UNDER FLUOROSCOPY PROCEDURE: An appropriate skin entry site was determined fluoroscopically. Operator donned sterile gloves and mask. Skin site was marked, then prepped with Betadine, draped in usual sterile fashion, and infiltrated locally with 1% lidocaine . A 20 gauge spinal needle advanced into the thecal sac at L4-L5 from a left interlaminar approach. Clear colorless CSF spontaneously returned, however, patient remained agitated, precluding safe collection of fluid. Procedure was aborted, and the needle was removed. The patient tolerated the procedure without difficulty. FLUOROSCOPY: Radiation Exposure Index (as provided by the fluoroscopic device): 72.50 mGy Kerma IMPRESSION: Technically successful lumbar puncture under fluoroscopy. Fluid collection was interrupted by patient agitation, prompting abortion of procedure. This exam was performed by Lambert Pillion, PA-C, and was supervised and interpreted by Dr. Myrlene Asper, MD. Electronically Signed   By: Myrlene Asper D.O.   On: 04/09/2024 13:03   EEG adult Result Date: 04/08/2024 Eleni Griffin, MD     04/09/2024 11:12 AM Routine EEG Report Samantha Burgess is a 63 y.o. female with a history of seizure who is undergoing an EEG to evaluate for seizures. Report: This EEG was acquired with electrodes placed according to the International 10-20 electrode system (including Fp1, Fp2, F3, F4, C3, C4, P3, P4, O1, O2, T3, T4, T5, T6, A1, A2, Fz, Cz, Pz). The following electrodes were missing or displaced: none. The occipital dominant rhythm was 6-7 Hz. This activity is  reactive to stimulation. Drowsiness was manifested by background fragmentation; deeper stages of sleep were identified by K complexes and sleep spindles. There was no focal slowing. There were no interictal epileptiform discharges. There were no electrographic seizures  identified. There was no abnormal response to photic stimulation or hyperventilation. Impression and clinical correlation: This EEG was obtained while awake and asleep and is abnormal due to mild-to-moderate diffuse slowing indicative of global cerebral dysfunction. Epileptiform abnormalities were not seen during this recording. Greg Leaks, MD [facility] [phone] If 7pm- 7am, please page neurology on call as listed in AMION.        Scheduled Meds:  Chlorhexidine  Gluconate Cloth  6 each Topical Daily   dexamethasone (DECADRON) injection  10 mg Intravenous Q6H   dextrose       insulin aspart  0-20 Units Subcutaneous Q4H   insulin aspart  4 Units Subcutaneous Q4H   levETIRAcetam  500 mg Intravenous Q12H   pantoprazole (PROTONIX) IV  40 mg Intravenous Q24H   rosuvastatin  20 mg Oral Daily   Continuous Infusions:  acetaminophen      acyclovir 750 mg (04/10/24 0527)   ampicillin  (OMNIPEN) IV 2 g (04/10/24 0922)   cefTRIAXone (ROCEPHIN)  IV 2 g (04/10/24 0956)   niCARDipine Stopped (04/09/24 1421)   vancomycin 750 mg (04/10/24 0631)     LOS: 2 days     Raymonde Calico, MD [facility]    If 7PM-7AM, please contact night-coverage [email or web address] Password [email or web address] 04/10/2024, 12:01 PM

## 2024-04-10 NOTE — Inpatient Diabetes Management (Signed)
 Inpatient Diabetes Program Recommendations  AACE/ADA: New Consensus Statement on Inpatient Glycemic Control (2015)  Target Ranges:  Prepandial:   less than 140 mg/dL      Peak postprandial:   less than 180 mg/dL (1-2 hours)      Critically ill patients:  140 - 180 mg/dL   Lab Results  Component Value Date   GLUCAP 60 (L) 04/10/2024   HGBA1C >15.5 (H) 04/08/2024    Review of Glycemic Control  Latest Reference Range & Units 04/09/24 16:31 04/09/24 19:48 04/09/24 23:25  Glucose-Capillary 70 - 99 mg/dL 361 (H) 443 (H) 90    Latest Reference Range & Units 04/09/24 23:25 04/10/24 03:29 04/10/24 03:32  Glucose-Capillary 70 - 99 mg/dL 90 56 (L) 60 (L)   Diabetes history: DM 2 Outpatient Diabetes medications:  Levemir 30 units q HS Humalog 50 units bid Janumet 50-1000 mg bid Current orders for Inpatient glycemic control:  Novolog 0-20 units q 4 hours Novolog 4 units q 4 hours Decadron 10 mg IV q 6 hours Inpatient Diabetes Program Recommendations:    Note hypoglycemia overnight.  Recommend d/c of Novolog 4 units q 4 hours due to patient not having tube feeds.  Also recommend reducing Novolog correction to moderate 0-15 units q 4 hours.  Will likely need Semglee 20 units bid resumed once CBG's>150  mg/dL.   Thanks,  Josefa Ni, RN, BC-ADM Inpatient Diabetes Coordinator Pager [phone]  (8a-5p)

## 2024-04-10 NOTE — Progress Notes (Signed)
 NAME:  Samantha Burgess, MRN:  [id number], DOB:  10-30-1961, LOS: 2 ADMISSION DATE:  04/07/2024, CONSULTATION DATE:  04/08/24 REFERRING MD:  Dr. Andy Bannister, CHIEF COMPLAINT:  Seizure, AMS   Brief Pt Description / Synopsis:  63 y.o. female admitted with Acute Metabolic Encephalopathy in the setting of Seizure due to [facility], concern for Todd Paralysis.  Ruling out meningitis.  History of Present Illness:  Samantha Burgess is a 63 y.o. 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 She presented to [facility] ED on 04/07/24  due to witnessed tonic clonic seizure at home.   Patient is currently altered and unable to contribute to history, and no family currently available, therefore history is obtained from chart review.  Per EMS patient was post ictal on arrival. In the field patient CBG was 127. LKW 9pm.Patient transported to ED for further care. Per family patient did not have any bowel or bladder incontinence and had LOC for around 30 sec per chart. Patient currently s/p ativan given for agitation. Patient not able to give history at this time and history is taken from chart as patient is sedated.   On evaluation in ED patient was found thave mild left facial droop and left arm and leg weakness, and confusion .  At that time code stroke was called. CTH was noted to be negative as well as CTA and CT perfusion. Patient was presumed to have Todd's paralysis however CVA could not be ruled out. Patient was then slated for MRI as well as EEG for further work up.  Further evaluation patient was found to have blood sugar of 710 for which she was started on endotool for diagnosis of HHS. Patient also noted to have elevated blood pressure with systolic in  190's.  ED Course: Initial Vital Signs: Temperature 98.2 F orally, pulse 110, respiratory rate 20, blood pressure  164/92, SpO2 96% on room air Significant Labs: Sodium 128, chloride 91, glucose 710, BUN 18, creatinine 1.18, beta hydroxybutyrate acid 0.87 VBG: pH 7.35/pCO2 50/pO2 40/bicarb 27.6 Urinalysis negative for UTI, with 20 ketones UDS positive for tricyclics, ethyl alcohol less than 15 Imaging CT Head w/o contrast (code stroke)>>IMPRESSION: 1. 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 2. No acute intracranial abnormality. 3. The right lens is not clearly identified. Correlation with the patient's surgical history is recommended CTa Head & Neck/Perfusion scan>>IMPRESSION: 1. Negative CTA of the head and neck. No large vessel occlusion or other emergent finding. No hemodynamically significant or correctable stenosis. 2. Negative CT perfusion with no evidence for acute ischemia or other perfusion abnormality Medications Administered: IV fluids, Ativan, Insulin gtt, Nicardipine gtt  Hospitalists asked to admit for further workup and treatment.  Please see "Significant Hospital Events" section below for full detailed hospital course.    Pertinent  Medical History   Past Medical History:  Diagnosis Date   Arthritis 2000   DM (diabetes mellitus) (HCC) 2005   GERD (gastroesophageal reflux disease)    HLD (hyperlipidemia)    HTN (hypertension) 2007   Obesity 1999   Sleep apnea 2007    Micro Data:  5/21: MRSA PCR>> negative 5/21: Blood cultures x2>>  Antimicrobials:   Anti-infectives (From admission, onward)    Start     Dose/Rate Route Frequency Ordered Stop   04/09/24 1030  ampicillin  (OMNIPEN) 2 g in sodium chloride   0.9 % 100 mL IVPB        2 g 300 mL/hr over 20 Minutes Intravenous Every 4 hours 04/09/24 0915     04/09/24 0600  vancomycin (VANCOREADY) IVPB 750 mg/150 mL        750 mg 150 mL/hr over 60 Minutes Intravenous Every 12 hours 04/08/24 1535     04/08/24 2200  cefTRIAXone (ROCEPHIN) 2 g in sodium chloride  0.9 % 100 mL IVPB         2 g 200 mL/hr over 30 Minutes Intravenous Every 12 hours 04/08/24 1525     04/08/24 1800  ampicillin  (OMNIPEN) 2 g in sodium chloride  0.9 % 100 mL IVPB  Status:  Discontinued        2 g 300 mL/hr over 20 Minutes Intravenous Every 6 hours 04/08/24 1525 04/09/24 0915   04/08/24 1700  vancomycin (VANCOREADY) IVPB 750 mg/150 mL  Status:  Discontinued        750 mg 150 mL/hr over 60 Minutes Intravenous Every 12 hours 04/08/24 1533 04/08/24 1535   04/08/24 1700  acyclovir (ZOVIRAX) 750 mg in dextrose 5 % 250 mL IVPB        10 mg/kg  75.1 kg 265 mL/hr over 60 Minutes Intravenous Every 8 hours 04/08/24 1542     04/08/24 1630  vancomycin (VANCOREADY) IVPB 1500 mg/300 mL        1,500 mg 150 mL/hr over 120 Minutes Intravenous  Once 04/08/24 1535 04/08/24 1913        Significant Hospital Events: Including procedures, antibiotic start and stop dates in addition to other pertinent events   5/20: Presented to ED with tonic clonic seizure.  Code stroke initiated, evaluated by TeleNeurology.  CT Head and CTA Heack and Neck/Perfusion scan all negative.   5/21: Admitted by TRH.  MRI brain negative for acute infarct, EEG pending.  PCCM consulted, becoming febrile (T max 102.6), start empiric menigitis coverage, consult IR for LP. 04/09/24- patient was able to say her name today, febrile overnight, for LP today.  Sugars improved from 700 down to 300.  Made 1L uop overnight. Repeating labs including lactate today. 04/10/24- patient appears to be clinically doing better now able to communicate in full sentences. She had LP done with abnormal findings, we are working with neurology and ID to further investigate potential CNS infection. I discussed her care with ID specialist today.    Objective    Blood pressure (!) 142/83, pulse 92, temperature 98.8 F (37.1 C), temperature source Oral, resp. rate 16, height 5\' 3"  (1.6 m), weight 80.4 kg, last menstrual period 03/30/2013, SpO2 100%.         Intake/Output Summary (Last 24 hours) at 04/10/2024 4098 Last data filed at 04/10/2024 0600 Gross per 24 hour  Intake 2489.5 ml  Output 1125 ml  Net 1364.5 ml   Filed Weights   04/08/24 0330 04/09/24 0500 04/10/24 0500  Weight: 75.1 kg 80.4 kg 80.4 kg    Examination: General: Acutely ill-appearing female, laying in bed, with mild tachypnea, no acute distress HENT: Atraumatic, normocephalic, neck supple, no JVD Lungs: Clear breath sounds throughout, even, mild tachypnea Cardiovascular: Tachycardia, regular rhythm, s1s2, no M/R/G Abdomen: Limited abdominal exam given AMS: Soft, nontender, nondistended, no guarding or rebound tenderness, BS + x4 Extremities: Normal bulk and tone, no deformities, no edema Neuro: Lethargic, withdraws from painful stimuli and moans GU: Deferred  Resolved Hospital Problem list     Assessment & Plan:   #Acute Metabolic Encephalopathy - possible  meningitis #New onset Seizure, suspect due to marked Hyperglycemia #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 #Rule out meningitis  -CT Head, CTa Head and Neck, CT perfusion all negative -MRI Brain negative for acute infarct -Treatment of metabolic derangements as outlined below -Seizure precautions -Prn Benzo's for recurrent seizures -Provide supportive care -Promote normal sleep/wake cycle and family presence -Avoid sedating medications as able -EEG pending -Neurology following, appreciate input ~ does not recommend AED at this time -ABX as above -PT/OT/Speech evaluations when mental status permits -Currently protecting her airway -LP - attempted unsucessfully by IR  #Hyperosmolar Hyperglycemia Stage -Follow HHS Protocol -Aggressive IV fluids -Already converted off Insulin gtt to Long acting insulin (Semglee 20 unit BID) + SSI -Diabetes Coordinator consulted, appreciate input -Check Hgb A1c  #Fever #Mild Leukocytosis #Concern for possible Meningitis -Monitor fever curve -Trend WBC's &  Procalcitonin -Follow cultures as above -Start empiric Ampicillin , Acyclovir, Ceftriaxone, and Vancomycin pending cultures & sensitivities -Start Decadron prior to/with antibiotics -Consult IR for LP  #Hypertension PMHx: HLD -Continuous cardiac monitoring -Maintain MAP >65 -IV fluids -Allow for permissive HTN  x48 hrs from sx onset per Neuro recs ~ goal BP <220/110 (15% reduction in 1st 24 hrs) -Prn Labetalol & Nicardipine gtt if needed   #Pseudo Hyponatremia in setting of marked Hyperglycemia #Acute Kidney Injury -Monitor I&O's / urinary output -Follow BMP -Ensure adequate renal perfusion -Avoid nephrotoxic agents as able -Replace electrolytes as indicated ~ Pharmacy following for assistance with electrolyte replacement    Best Practice (right click and "Reselect all SmartList Selections" daily)   Diet/type: NPO DVT prophylaxis: SCD GI prophylaxis: PPI Lines: N/A Foley:  N/A Code Status:  full code Last date of multidisciplinary goals of care discussion [N/A]   Labs   CBC: Recent Labs  Lab 04/07/24 2208 04/08/24 0836 04/09/24 0315 04/10/24 0507  WBC 6.8 11.5* 11.5* 10.8*  NEUTROABS 3.7  --   --   --   HGB 13.1 13.2 14.8 11.8*  HCT 39.9 40.1 43.8 35.1*  MCV 85.4 85.0 84.2 85.6  PLT 392 402* 401* 342    Basic Metabolic Panel: Recent Labs  Lab 04/07/24 2208 04/08/24 0828 04/09/24 0315 04/10/24 0507  NA 128* 133* 133* 138  K 3.7 3.7 3.9 3.4*  CL 91* 98 99 107  CO2 22 22 22 24   GLUCOSE 710* 269* 294* 94  BUN 18 12 9  24*  CREATININE 1.18* 0.74 0.76 0.92  CALCIUM 9.3 9.2 9.0 8.3*  MG  --  1.7 2.0 2.0  PHOS  --  2.1* 2.8 4.2   GFR: Estimated Creatinine Clearance: 63.7 mL/min (by C-G formula based on SCr of 0.92 mg/dL). Recent Labs  Lab 04/07/24 2208 04/08/24 0828 04/08/24 0836 04/08/24 1056 04/08/24 1332 04/09/24 0315 04/09/24 1053 04/09/24 1326 04/10/24 0507  PROCALCITON  --   --   --   --  <0.10  --   --   --   --   WBC 6.8  --  11.5*  --    --  11.5*  --   --  10.8*  LATICACIDVEN  --  2.8*  --  2.4*  --   --  1.3 1.5  --     Liver Function Tests: Recent Labs  Lab 04/08/24 0828  AST 22  ALT 16  ALKPHOS 128*  BILITOT 0.5  PROT 7.3  ALBUMIN 3.9   No results for input(s): "LIPASE", "AMYLASE" in the last 168 hours. No results for input(s): "AMMONIA" in the last 168 hours.  ABG  Component Value Date/Time   HCO3 31.1 (H) 04/08/2024 1332   O2SAT 38.9 04/08/2024 1332     Coagulation Profile: Recent Labs  Lab 04/07/24 2208  INR 1.0    Cardiac Enzymes: No results for input(s): "CKTOTAL", "CKMB", "CKMBINDEX", "TROPONINI" in the last 168 hours.  HbA1C: Hgb A1c MFr Bld  Date/Time Value Ref Range Status  04/08/2024 08:28 AM >15.5 (H) 4.8 - 5.6 % Final    Comment:    (NOTE)         Prediabetes: 5.7 - 6.4         Diabetes: >6.4         Glycemic control for adults with diabetes: <7.0     CBG: Recent Labs  Lab 04/09/24 1948 04/09/24 2325 04/10/24 0329 04/10/24 0332 04/10/24 0745  GLUCAP 185* 90 56* 60* 106*    Review of Systems:   Unable to assess due to AMS   Past Medical History:  She,  has a past medical history of Arthritis (2000), DM (diabetes mellitus) (HCC) (2005), GERD (gastroesophageal reflux disease), HLD (hyperlipidemia), HTN (hypertension) (2007), Obesity (1999), and Sleep apnea (2007).   Surgical History:   Past Surgical History:  Procedure Laterality Date   CESAREAN SECTION     IR FLUORO GUIDE CV LINE RIGHT  05/10/2017   KNEE ARTHROSCOPY Right    UPPER GASTROINTESTINAL ENDOSCOPY       Social History:   reports that she has never smoked. She has never used smokeless tobacco. She reports that she does not drink alcohol and does not use drugs.   Family History:  Her family history includes Diabetes in her maternal grandmother and mother; Esophageal cancer in her paternal grandmother. There is no history of Colon cancer, Rectal cancer, or Stomach cancer.   Allergies No Known  Allergies   Home Medications  Prior to Admission medications   Medication Sig Start Date End Date Taking? Authorizing Provider  albuterol  (VENTOLIN  HFA) 108 (90 Base) MCG/ACT inhaler Inhale 2 puffs into the lungs every 4 (four) hours as needed for shortness of breath. 01/28/24   Menshew, Raye Cai, PA-C  amitriptyline (ELAVIL) 10 MG tablet Take 40-50 mg by mouth at bedtime.    [provider]  benzonatate  (TESSALON  PERLES) 100 MG capsule Take 1-2 tabs TID prn cough 01/28/24   Menshew, Jenise V Bacon, PA-C  fluticasone  (FLONASE ) 50 MCG/ACT nasal spray Place 1 spray into both nostrils daily. 01/28/24   Menshew, Raye Cai, PA-C  hydrochlorothiazide (HYDRODIURIL) 25 MG tablet Take 25 mg by mouth daily.    [provider]  insulin aspart protamine- aspart (NOVOLOG MIX 70/30) (70-30) 100 UNIT/ML injection Inject into the skin.    [provider]  insulin detemir (LEVEMIR) 100 UNIT/ML injection Inject 30 Units into the skin at bedtime.    [provider]  insulin lispro (HUMALOG) 100 UNIT/ML injection Inject 50 Units into the skin 2 (two) times daily.    [provider]  oxyCODONE-acetaminophen  (PERCOCET) 10-325 MG per tablet Take 1 tablet by mouth 4 (four) times daily as needed for pain.    [provider]  ramipril (ALTACE) 10 MG capsule Take 10 mg by mouth daily.    [provider]  simvastatin (ZOCOR) 40 MG tablet Take 40 mg by mouth daily.    [provider]  sitaGLIPtan-metformin (JANUMET) 50-1000 MG per tablet Take 1 tablet by mouth 2 (two) times daily with a meal.    [provider]     Critical  care provider statement:   Total critical care time: 33 minutes   Performed by: Jaclynn Mast MD   Critical care time was exclusive of separately billable procedures and treating other patients.   Critical care was necessary to treat or prevent imminent or life-threatening deterioration.   Critical care was time  spent personally by me on the following activities: development of treatment plan with patient and/or surrogate as well as nursing, discussions with consultants, evaluation of patient's response to treatment, examination of patient, obtaining history from patient or surrogate, ordering and performing treatments and interventions, ordering and review of laboratory studies, ordering and review of radiographic studies, pulse oximetry and re-evaluation of patient's condition.    Aviendha Azbell, M.D.  Pulmonary & Critical Care Medicine

## 2024-04-10 NOTE — Progress Notes (Signed)
 Speech Language Pathology Treatment: Cognitive-Linquistic  Patient Details Name: Samantha Burgess MRN: [id number] DOB: December 03, 1960 Today's Date: 04/10/2024 Time: 4098-1191 SLP Time Calculation (min) (ACUTE ONLY): 8 min  Assessment / Plan / Recommendation Clinical Impression  Pt seen for ongoing cognitive communication therapy. Pt presents with remarkable improvement - she is alert, oriented, demonstrates appropriate sustain and selective attention to tasks, follows one step directions, is able to communicate wants/needs and converse without any evidence of word finding difficulty. At this time, skilled ST services are not indicated while inpatient. Would recommend increased support at home during initial discharge to ensure medication compliance to prevent additional hospitalization.   HPI HPI: Samantha Burgess is a 63 y.o. 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 Patient presents to  s/ witnessed tonic clonic seizure at home. Per EMS patient was post ictal on arrival. In the field patient CBG was 127. LKW 9pm.Patient transported to ED for further care. Per family patient did not have any bowel or bladder incontinence and had LOC for around 30 sec per chart. MRI negative, EEG negative.      SLP Plan  All goals met      Recommendations for follow up therapy are one component of a multi-disciplinary discharge planning process, led by the attending physician.  Recommendations may be updated based on patient status, additional functional criteria and insurance authorization.    Recommendations  Diet recommendations: Regular;Thin liquid Medication Administration: Whole meds with liquid Compensations: Minimize environmental distractions;Slow rate;Small sips/bites                  Oral care BID   Intermittent  Supervision/Assistance Cognitive communication deficit (R41.841);Dysphagia, unspecified (R13.10)     All goals met    Sommer Spickard B. Garlin Junker, M.S., CCC-SLP, Tree surgeon Certified Brain Injury Specialist [facility]  [facility] Rehabilitation Services Office [phone] Ascom [phone] Fax [phone]

## 2024-04-11 ENCOUNTER — Inpatient Hospital Stay

## 2024-04-11 DIAGNOSIS — R569 Unspecified convulsions: Secondary | ICD-10-CM | POA: Diagnosis not present

## 2024-04-11 LAB — BASIC METABOLIC PANEL WITH GFR
Anion gap: 11 (ref 5–15)
BUN: 21 mg/dL (ref 8–23)
CO2: 18 mmol/L — ABNORMAL LOW (ref 22–32)
Calcium: 8.2 mg/dL — ABNORMAL LOW (ref 8.9–10.3)
Chloride: 104 mmol/L (ref 98–111)
Creatinine, Ser: 0.74 mg/dL (ref 0.44–1.00)
GFR, Estimated: >60 mL/min (ref >60)
Glucose, Bld: 216 mg/dL — ABNORMAL HIGH (ref 70–99)
Potassium: 3.7 mmol/L (ref 3.5–5.1)
Sodium: 133 mmol/L — ABNORMAL LOW (ref 135–145)

## 2024-04-11 LAB — CBC
HCT: 37.1 % (ref 36.0–46.0)
Hemoglobin: 12.5 g/dL (ref 12.0–15.0)
MCH: 28.7 pg (ref 26.0–34.0)
MCHC: 33.7 g/dL (ref 30.0–36.0)
MCV: 85.3 fL (ref 80.0–100.0)
Platelets: 349 10*3/uL (ref 150–400)
RBC: 4.35 MIL/uL (ref 3.87–5.11)
RDW: 13.2 % (ref 11.5–15.5)
WBC: 11 10*3/uL — ABNORMAL HIGH (ref 4.0–10.5)
nRBC: 0 % (ref 0.0–0.2)

## 2024-04-11 LAB — GLUCOSE, CAPILLARY
Glucose-Capillary: 113 mg/dL — ABNORMAL HIGH (ref 70–99)
Glucose-Capillary: 215 mg/dL — ABNORMAL HIGH (ref 70–99)
Glucose-Capillary: 195 mg/dL — ABNORMAL HIGH (ref 70–99)
Glucose-Capillary: 210 mg/dL — ABNORMAL HIGH (ref 70–99)
Glucose-Capillary: 226 mg/dL — ABNORMAL HIGH (ref 70–99)

## 2024-04-11 LAB — MAGNESIUM: Magnesium: 2 mg/dL (ref 1.7–2.4)

## 2024-04-11 LAB — PHOSPHORUS: Phosphorus: 3.7 mg/dL (ref 2.5–4.6)

## 2024-04-11 MED ORDER — AMITRIPTYLINE HCL 10 MG PO TABS
40.0000 mg | ORAL_TABLET | Freq: Every day | ORAL | Status: DC
  Administered 2024-04-11: 40 mg via ORAL
  Filled 2024-04-11 (×2): qty 4

## 2024-04-11 MED ORDER — INSULIN GLARGINE-YFGN 100 UNIT/ML ~~LOC~~ SOLN
10.0000 [IU] | Freq: Every day | SUBCUTANEOUS | Status: DC
  Administered 2024-04-11: 10 [IU] via SUBCUTANEOUS
  Filled 2024-04-11 (×2): qty 0.1

## 2024-04-11 MED ORDER — LEVETIRACETAM 500 MG PO TABS
500.0000 mg | ORAL_TABLET | Freq: Two times a day (BID) | ORAL | Status: DC
  Administered 2024-04-11 – 2024-04-12 (×2): 500 mg via ORAL
  Filled 2024-04-11 (×2): qty 1

## 2024-04-11 MED ORDER — AMITRIPTYLINE HCL 10 MG PO TABS
40.0000 mg | ORAL_TABLET | Freq: Every day | ORAL | Status: DC
  Filled 2024-04-11: qty 5

## 2024-04-11 MED ORDER — ENOXAPARIN SODIUM 60 MG/0.6ML IJ SOSY
0.5000 mg/kg | PREFILLED_SYRINGE | INTRAMUSCULAR | Status: DC
  Administered 2024-04-11: 55 mg via SUBCUTANEOUS
  Filled 2024-04-11: qty 0.6

## 2024-04-11 MED ORDER — IOHEXOL 300 MG/ML  SOLN
80.0000 mL | Freq: Once | INTRAMUSCULAR | Status: AC | PRN
  Administered 2024-04-11: 80 mL via INTRAVENOUS

## 2024-04-11 NOTE — Progress Notes (Signed)
 Mobility Specialist - Progress Note     04/11/24 1438  Mobility  Activity Transferred to/from [facility]  Level of Assistance Minimal assist, patient does 75% or more  Assistive Device BSC  Range of Motion/Exercises Active  Activity Response Tolerated well  Mobility Referral Yes  Mobility visit 1 Mobility  Mobility Specialist Start Time (ACUTE ONLY) 1403  Mobility Specialist Stop Time (ACUTE ONLY) 1413  Mobility Specialist Time Calculation (min) (ACUTE ONLY) 10 min   Pt resting in bed on RA upon entry. Pt STS and transfers to [facility] utilizing [facility] for assistance. Pt left on [facility] with needs in reach and NT walks in as MS is walking out.   Jerri Morale Mobility Specialist 04/11/24, 3:00 PM

## 2024-04-11 NOTE — Progress Notes (Signed)
 PHARMACIST - PHYSICIAN COMMUNICATION  CONCERNING:  Enoxaparin (Lovenox) for DVT Prophylaxis    RECOMMENDATION: Patient was prescribed enoxaprin 40mg  q24 hours for VTE prophylaxis.   Filed Weights   04/09/24 0500 04/10/24 0500 04/11/24 0500  Weight: 80.4 kg (177 lb 4 oz) 80.4 kg (177 lb 4 oz) 108.2 kg (238 lb 8.6 oz)    Body mass index is 42.26 kg/m.  Estimated Creatinine Clearance: 86 mL/min (by C-G formula based on SCr of 0.74 mg/dL).   Based on [facility] policy patient is candidate for enoxaparin 0.5mg /kg TBW SQ every 24 hours based on BMI being >30.  DESCRIPTION: Pharmacy has adjusted enoxaparin dose per [facility] policy.  Patient is now receiving enoxaparin 55 mg every 24 hours    Ananias Balls, PharmD Clinical Pharmacist  04/11/2024 3:56 PM

## 2024-04-11 NOTE — Plan of Care (Signed)

## 2024-04-11 NOTE — Plan of Care (Signed)
  Problem: Education: Goal: Knowledge of General Education information will improve Description: Including pain rating scale, medication(s)/side effects and non-pharmacologic comfort measures Outcome: Progressing   Problem: Clinical Measurements: Goal: Ability to maintain clinical measurements within normal limits will improve Outcome: Progressing Goal: Will remain free from infection Outcome: Progressing Goal: Diagnostic test results will improve Outcome: Progressing Goal: Respiratory complications will improve Outcome: Progressing Goal: Cardiovascular complication will be avoided Outcome: Progressing   Problem: Elimination: Goal: Will not experience complications related to bowel motility Outcome: Progressing Goal: Will not experience complications related to urinary retention Outcome: Progressing   Problem: Safety: Goal: Ability to remain free from injury will improve Outcome: Progressing   Problem: Education: Goal: Knowledge of disease or condition will improve Outcome: Progressing Goal: Knowledge of secondary prevention will improve (MUST DOCUMENT ALL) Outcome: Progressing Goal: Knowledge of patient specific risk factors will improve (DELETE if not current risk factor) Outcome: Progressing   Problem: Coping: Goal: Will verbalize positive feelings about self Outcome: Progressing Goal: Will identify appropriate support needs Outcome: Progressing

## 2024-04-11 NOTE — Discharge Instructions (Addendum)
 [facility].  [address] , [place], [place], [address]. [phone] They will call you to arrange when they can come to see you

## 2024-04-11 NOTE — Progress Notes (Signed)
 PROGRESS NOTE    Samantha Burgess  ZOX:[id number] DOB: 27-Feb-1961 DOA: 04/07/2024 PCP: Ladonna Pickup, MD  Outpatient Specialists: none    Brief Narrative:   From admission h and p  Samantha Burgess is a 63 y.o. 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 Patient presents to  s/ witnessed tonic clonic seizure at home. Per EMS patient was post ictal on arrival. In the field patient CBG was 127. LKW 9pm.Patient transported to ED for further care. Per family patient did not have any bowel or bladder incontinence and had LOC for around 30 sec per chart. Patient currently s/p ativan  given for agitation. Patient not able to give history at this time and history is taken from chart as patient is sedated.    Assessment & Plan:   Principal Problem:   Seizure (HCC) Active Problems:   Essential hypertension   Type 2 diabetes mellitus with stage 2 chronic kidney disease, with long-term current use of insulin  (HCC)   Obesity (BMI 30-39.9)  # HHS # T2DM, uncontrolled Much improved, now tolerating po. A1c > 15.5. - SSI - resume basal semglee  10 qhs  # Seizure? Encephalopathy resolved. MRI motion degraded but negative, CTA negative. HHS most likely culprit. PRES possible. CT venogram negative for dvst.  - keppra  per neuro  # Fever # Meningitis? # SIRS Initial concern for meningitis. Respiratory panels negative. Cxr clear. Traumatic tap, meningitis pcr negative.  - monitor cultures, ngtd - ID to see - on empiric acyclofir, ceftriaxone , vancomycin , ampicillin , dexamethasone  to start. Now weaned down to ceftriaxone   # Patellar fracture Marvell Slider and injured left knee a couple of weeks ago. CT shows patellar fracture - will ask ortho to review. Likely needs knee immobilizer  # Hypertensive urgency/emergency MRI nothing acute. BP has  normalized - cont home ramipril , hctz  # Obesity Noted  # Chronic pain - resume home oxy  # Debility PT advises home health with rolling walker   DVT prophylaxis: lovenox  Code Status: full Family Communication: sister updated @ bedside 5/24  Level of care: Med-Surg Status is: Inpatient Remains inpatient appropriate because: severity of illness    Consultants:  Pccm, neurology, infectious disease  Procedures: LP  Antimicrobials:  See above    Subjective: Feeling well, ongoing left knee pain  Objective: Vitals:   04/10/24 2026 04/11/24 0330 04/11/24 0500 04/11/24 0811  BP: (!) 140/81 (!) 140/75  (!) 148/90  Pulse: 88 88  91  Resp: 18 16  18   Temp: 98.6 F (37 C) 97.6 F (36.4 C)  97.9 F (36.6 C)  TempSrc: Oral     SpO2: 99% 100%  100%  Weight:   108.2 kg   Height:       No intake or output data in the 24 hours ending 04/11/24 1549  Filed Weights   04/09/24 0500 04/10/24 0500 04/11/24 0500  Weight: 80.4 kg 80.4 kg 108.2 kg    Examination:  General exam: NAD Respiratory system: Clear to auscultation. Respiratory effort normal. Cardiovascular system: S1 & S2 heard, tachycardic Gastrointestinal system: Abdomen is obese, soft and nontender.   Central nervous system: moving all 4, aao x4 Extremities: mild effusion left knee which is warm and tender Skin: No rashes, lesions or ulcers Psychiatry: calm     Data Reviewed: I have personally reviewed following labs and imaging studies  CBC: Recent  Labs  Lab 04/07/24 2208 04/08/24 0836 04/09/24 0315 04/10/24 0507 04/11/24 0457  WBC 6.8 11.5* 11.5* 10.8* 11.0*  NEUTROABS 3.7  --   --   --   --   HGB 13.1 13.2 14.8 11.8* 12.5  HCT 39.9 40.1 43.8 35.1* 37.1  MCV 85.4 85.0 84.2 85.6 85.3  PLT 392 402* 401* 342 349   Basic Metabolic Panel: Recent Labs  Lab 04/07/24 2208 04/08/24 0828 04/09/24 0315 04/10/24 0507 04/11/24 0457  NA 128* 133* 133* 138 133*  K 3.7 3.7 3.9 3.4* 3.7  CL 91* 98  99 107 104  CO2 22 22 22 24  18*  GLUCOSE 710* 269* 294* 94 216*  BUN 18 12 9  24* 21  CREATININE 1.18* 0.74 0.76 0.92 0.74  CALCIUM 9.3 9.2 9.0 8.3* 8.2*  MG  --  1.7 2.0 2.0 2.0  PHOS  --  2.1* 2.8 4.2 3.7   GFR: Estimated Creatinine Clearance: 86 mL/min (by C-G formula based on SCr of 0.74 mg/dL). Liver Function Tests: Recent Labs  Lab 04/08/24 0828  AST 22  ALT 16  ALKPHOS 128*  BILITOT 0.5  PROT 7.3  ALBUMIN 3.9   No results for input(s): "LIPASE", "AMYLASE" in the last 168 hours. No results for input(s): "AMMONIA" in the last 168 hours. Coagulation Profile: Recent Labs  Lab 04/07/24 2208  INR 1.0   Cardiac Enzymes: Recent Labs  Lab 04/10/24 0507  CKTOTAL 46   BNP (last 3 results) No results for input(s): "PROBNP" in the last 8760 hours. HbA1C: No results for input(s): "HGBA1C" in the last 72 hours.  CBG: Recent Labs  Lab 04/10/24 1106 04/10/24 1537 04/10/24 2118 04/11/24 0812 04/11/24 1204  GLUCAP 137* 215* 202* 215* 195*   Lipid Profile: No results for input(s): "CHOL", "HDL", "LDLCALC", "TRIG", "CHOLHDL", "LDLDIRECT" in the last 72 hours.  Thyroid Function Tests: No results for input(s): "TSH", "T4TOTAL", "FREET4", "T3FREE", "THYROIDAB" in the last 72 hours. Anemia Panel: No results for input(s): "VITAMINB12", "FOLATE", "FERRITIN", "TIBC", "IRON", "RETICCTPCT" in the last 72 hours. Urine analysis:    Component Value Date/Time   COLORURINE STRAW (A) 04/08/2024 1258   APPEARANCEUR CLEAR (A) 04/08/2024 1258   LABSPEC 1.025 04/08/2024 1258   PHURINE 7.0 04/08/2024 1258   GLUCOSEU >=500 (A) 04/08/2024 1258   HGBUR NEGATIVE 04/08/2024 1258   BILIRUBINUR NEGATIVE 04/08/2024 1258   KETONESUR 20 (A) 04/08/2024 1258   PROTEINUR >=300 (A) 04/08/2024 1258   NITRITE NEGATIVE 04/08/2024 1258   LEUKOCYTESUR NEGATIVE 04/08/2024 1258   Sepsis Labs: @LABRCNTIP (procalcitonin:4,lacticidven:4)  ) Recent Results (from the past 240 hours)  MRSA Next Gen by  PCR, Nasal     Status: None   Collection Time: 04/08/24  3:10 AM   Specimen: Nasal Mucosa; Nasal Swab  Result Value Ref Range Status   MRSA by PCR Next Gen NOT DETECTED NOT DETECTED Final    Comment: (NOTE) The GeneXpert MRSA Assay (FDA approved for NASAL specimens only), is one component of a comprehensive MRSA colonization surveillance program. It is not intended to diagnose MRSA infection nor to guide or monitor treatment for MRSA infections. Test performance is not FDA approved in patients less than 66 years old. Performed at [facility], [address] [place], [place] [address]   Respiratory (~20 pathogens) panel by PCR     Status: None   Collection Time: 04/08/24  3:29 PM   Specimen: Nasopharyngeal Swab; Respiratory  Result Value Ref Range Status   Adenovirus NOT DETECTED NOT DETECTED  Final   Coronavirus 229E NOT DETECTED NOT DETECTED Final    Comment: (NOTE) The Coronavirus on the Respiratory Panel, DOES NOT test for the novel  Coronavirus (2019 nCoV)    Coronavirus HKU1 NOT DETECTED NOT DETECTED Final   Coronavirus NL63 NOT DETECTED NOT DETECTED Final   Coronavirus OC43 NOT DETECTED NOT DETECTED Final   Metapneumovirus NOT DETECTED NOT DETECTED Final   Rhinovirus / Enterovirus NOT DETECTED NOT DETECTED Final   Influenza A NOT DETECTED NOT DETECTED Final   Influenza B NOT DETECTED NOT DETECTED Final   Parainfluenza Virus 1 NOT DETECTED NOT DETECTED Final   Parainfluenza Virus 2 NOT DETECTED NOT DETECTED Final   Parainfluenza Virus 3 NOT DETECTED NOT DETECTED Final   Parainfluenza Virus 4 NOT DETECTED NOT DETECTED Final   Respiratory Syncytial Virus NOT DETECTED NOT DETECTED Final   Bordetella pertussis NOT DETECTED NOT DETECTED Final   Bordetella Parapertussis NOT DETECTED NOT DETECTED Final   Chlamydophila pneumoniae NOT DETECTED NOT DETECTED Final   Mycoplasma pneumoniae NOT DETECTED NOT DETECTED Final    Comment: Performed at [facility],  [address], [place], [place] [address]  Culture, blood (Routine X 2) w Reflex to ID Panel     Status: None (Preliminary result)   Collection Time: 04/08/24  4:38 PM   Specimen: BLOOD  Result Value Ref Range Status   Specimen Description BLOOD BLOOD LEFT HAND  Final   Special Requests   Final    BOTTLES DRAWN AEROBIC AND ANAEROBIC Blood Culture results may not be optimal due to an inadequate volume of blood received in culture bottles   Culture   Final    NO GROWTH 3 DAYS Performed at [facility], [address], [place], [place] [address]    Report Status PENDING  Incomplete  Culture, blood (Routine X 2) w Reflex to ID Panel     Status: None (Preliminary result)   Collection Time: 04/08/24  4:38 PM   Specimen: BLOOD  Result Value Ref Range Status   Specimen Description BLOOD BLOOD RIGHT WRIST  Final   Special Requests   Final    BOTTLES DRAWN AEROBIC AND ANAEROBIC Blood Culture adequate volume   Culture   Final    NO GROWTH 3 DAYS Performed at [facility], [address], [place], [place] [address]    Report Status PENDING  Incomplete  CSF culture w Gram Stain     Status: None (Preliminary result)   Collection Time: 04/09/24  4:08 PM   Specimen: CSF; Cerebrospinal Fluid  Result Value Ref Range Status   Specimen Description   Final    CSF Performed at [facility], [address], [place], [place] [address]    Special Requests   Final    CSF Performed at [facility], [address] [place], [place] [address]    Gram Stain   Final    WBC SEEN RED BLOOD CELLS PRESENT NO ORGANISMS SEEN Performed at [facility], [address] [place], [place], [place] [address]    Culture   Final    NO GROWTH 2 DAYS Performed at [facility], [address] [place], [place] [address]    Report Status PENDING  Incomplete         Radiology Studies: CT VENOGRAM HEAD Result Date: 04/11/2024 CLINICAL DATA:  Dural venous sinus  thrombosis suspected EXAM: CT VENOGRAM HEAD TECHNIQUE: Venographic phase images of the brain were obtained following the administration  of intravenous contrast. Multiplanar reformats and maximum intensity projections were generated. RADIATION DOSE REDUCTION: This exam was performed according to the departmental dose-optimization program which includes automated exposure control, adjustment of the mA and/or kV according to patient size and/or use of iterative reconstruction technique. CONTRAST:  80mL OMNIPAQUE IOHEXOL 300 MG/ML  SOLN COMPARISON:  Brain MRI from 3 days ago FINDINGS: Dural venous sinuses are diffusely patent without stricture. Unremarkable central and cortical venous enhancement. Proximal basilar fenestration. No significant arterial finding. Noncontrast images show no infarct, hemorrhage, hydrocephalus, mass, or collection. IMPRESSION: Negative CT venogram Electronically Signed   By: Ronnette Coke M.D.   On: 04/11/2024 10:42   CT KNEE LEFT W CONTRAST Result Date: 04/10/2024 CLINICAL DATA:  Knee pain and swelling.  Fever. EXAM: CT OF THE LEFT KNEE WITH CONTRAST TECHNIQUE: Multidetector CT imaging was performed following the standard protocol during bolus administration of intravenous contrast. RADIATION DOSE REDUCTION: This exam was performed according to the departmental dose-optimization program which includes automated exposure control, adjustment of the mA and/or kV according to patient size and/or use of iterative reconstruction technique. CONTRAST:  100mL OMNIPAQUE IOHEXOL 300 MG/ML  SOLN COMPARISON:  None Available. FINDINGS: Bones/Joint/Cartilage Acute transverse fracture of the patella at the junction of the proximal pole and mid pole with mild comminution. Calcification along the anterior periosteum of the patella in the vicinity of the fibrocartilaginous continuation of the quadriceps tendon. Cortical surface irregularity in the femoral trochlear groove especially centrally and along  adjacent portions of the lateral facet, and inferiorly along the medial facet. Some of this may represent confluent degenerative osteochondral lesions Tricompartmental spurring compatible with ostial arthritis. Severe medial compartmental articular space narrowing compatible with severe chondral thinning, with associated subcortical sclerosis as on image 30 series 6. Small knee effusion not disproportionate to the size fracture. No obvious abnormal thickening of the synovium to indicate septic joint. No gas in the joint. Ligaments Suboptimally assessed by CT. Muscles and Tendons Mildly thickened patellar tendon. Soft tissues Prepatellar subcutaneous edema. IMPRESSION: 1. Acute transverse fracture of the patella at the junction of the proximal pole and mid pole with mild comminution. 2. Small knee effusion not disproportionate to the fracture. No obvious abnormal thickening of the synovium to indicate septic joint. No gas in the joint. 3. Tricompartmental osteoarthritis, with severe medial compartmental articular space narrowing and subcortical sclerosis. 4. Cortical surface irregularity in the femoral trochlear groove especially centrally and along adjacent portions of the lateral facet, and inferiorly along the medial facet. Some of this may represent confluent degenerative osteochondral lesions. This is likely primarily degenerative. 5. Mildly thickened patellar tendon. 6. Prepatellar subcutaneous edema. Electronically Signed   By: Freida Jes M.D.   On: 04/10/2024 16:25        Scheduled Meds:  Chlorhexidine  Gluconate Cloth  6 each Topical Daily   hydrochlorothiazide  25 mg Oral Daily   insulin aspart  0-15 Units Subcutaneous TID WC   insulin aspart  0-5 Units Subcutaneous QHS   insulin glargine-yfgn  10 Units Subcutaneous QHS   levETIRAcetam  500 mg Intravenous Q12H   ramipril  10 mg Oral Daily   rosuvastatin  20 mg Oral Daily   Continuous Infusions:  cefTRIAXone (ROCEPHIN)  IV 2 g  (04/11/24 0943)     LOS: 3 days     Raymonde Calico, MD [facility]    If 7PM-7AM, please contact night-coverage [email or web address] Password [email or web address] 04/11/2024, 3:49 PM

## 2024-04-12 DIAGNOSIS — I7389 Other specified peripheral vascular diseases: Secondary | ICD-10-CM | POA: Insufficient documentation

## 2024-04-12 DIAGNOSIS — S82009A Unspecified fracture of unspecified patella, initial encounter for closed fracture: Secondary | ICD-10-CM | POA: Insufficient documentation

## 2024-04-12 DIAGNOSIS — R569 Unspecified convulsions: Secondary | ICD-10-CM | POA: Diagnosis not present

## 2024-04-12 LAB — BASIC METABOLIC PANEL WITH GFR
Anion gap: 11 (ref 5–15)
BUN: 13 mg/dL (ref 8–23)
CO2: 23 mmol/L (ref 22–32)
Calcium: 8.8 mg/dL — ABNORMAL LOW (ref 8.9–10.3)
Chloride: 102 mmol/L (ref 98–111)
Creatinine, Ser: 0.73 mg/dL (ref 0.44–1.00)
GFR, Estimated: >60 mL/min (ref >60)
Glucose, Bld: 166 mg/dL — ABNORMAL HIGH (ref 70–99)
Potassium: 4 mmol/L (ref 3.5–5.1)
Sodium: 136 mmol/L (ref 135–145)

## 2024-04-12 LAB — GLUCOSE, CAPILLARY
Glucose-Capillary: 160 mg/dL — ABNORMAL HIGH (ref 70–99)
Glucose-Capillary: 150 mg/dL — ABNORMAL HIGH (ref 70–99)
Glucose-Capillary: 186 mg/dL — ABNORMAL HIGH (ref 70–99)

## 2024-04-12 MED ORDER — AMOXICILLIN 500 MG PO TABS: 1000.0000 mg | ORAL_TABLET | Freq: Three times a day (TID) | ORAL | 0 refills | Status: AC

## 2024-04-12 MED ORDER — LEVETIRACETAM 500 MG PO TABS: 500.0000 mg | ORAL_TABLET | Freq: Two times a day (BID) | ORAL | 1 refills | Status: AC

## 2024-04-12 MED ORDER — HUMALOG 100 UNIT/ML ~~LOC~~ SOLN: 10.0000 [IU] | Freq: Two times a day (BID) | SUBCUTANEOUS | Status: AC

## 2024-04-12 NOTE — Progress Notes (Addendum)
       Date: 04/12/2024  Patient name: Samantha Burgess  Medical record number: [id number]  Date of birth: October 04, 1961   Case reviewed with Dr. Sari Cunning  Seems incredibly unlikely to be a bacterial meningitis and pt better and wants to go home  I think its reasonable to DC with high dose amoxicillin  1 gram po TID to complete total of 14 days of therapy in case patient really does have bacterial meningitis. I think this is best option to "hedge our bets" given that pt will dc home.  Sheela Denmark 04/12/2024, 11:53 AM

## 2024-04-12 NOTE — TOC Progression Note (Signed)
 Transition of Care [facility]) - Progression Note    Patient Details  Name: Samantha Burgess MRN: [id number] Date of Birth: 1961-05-03  [facility] CM/SW Contact  Alexandra Ice, RN Phone Number: 04/12/2024, 11:04 AM  Clinical Narrative:     Received message from MD, patient to discharge today. Needs RW. Patient set up with [facility]. Sent DME order to [employer] for processing.  Expected Discharge Plan: Home w Home Health Services Barriers to Discharge: Continued Medical Work up  Expected Discharge Plan and Services   Discharge Planning Services: CM Consult Post Acute Care Choice: Home Health Living arrangements for the past 2 months: Single Family Home                   DME Agency: NA       HH Arranged: PT, OT HH Agency: [facility] Date HH Agency Contacted: 04/11/24 Time HH Agency Contacted: [phone] Representative spoke with at [facility] Agency: [place]    Social Determinants of Health (SDOH) Interventions SDOH Screenings   Food Insecurity: Patient Unable To Answer (04/08/2024)  Housing: Patient Unable To Answer (04/08/2024)  Transportation Needs: Patient Unable To Answer (04/08/2024)  Utilities: Patient Unable To Answer (04/08/2024)  Social Connections: Unknown (04/03/2022)   Received from [facility]  Tobacco Use: Low Risk  (04/07/2024)    Readmission Risk Interventions     No data to display

## 2024-04-12 NOTE — TOC Transition Note (Signed)
 Transition of Care [facility]) - Discharge Note   Patient Details  Name: Samantha Burgess MRN: [id number] Date of Birth: 1960-11-28  Transition of Care [facility] CM/SW Contact:  Alexandra Ice, RN Phone Number: 04/12/2024, 11:45 AM   Clinical Narrative:    Patient to discharge today, home with home health services. [facility] set up, information on AVS. Patient needs rolling walker, sent to [employer] for processing, will be delivered at bedside.    Final next level of care: Home w Home Health Services Barriers to Discharge: Barriers Resolved   Patient Goals and CMS Choice Patient states their goals for this hospitalization and ongoing recovery are:: get better CMS Medicare.gov Compare Post Acute Care list provided to:: Patient Choice offered to / list presented to : Patient [place] ownership interest in [facility].provided to:: Sibling    Discharge Placement                Patient to be transferred to facility by: Sherell Dill, daughter Name of family member notified: Sherell Dill Patient and family notified of of transfer: 04/12/24  Discharge Plan and Services Additional resources added to the After Visit Summary for     Discharge Planning Services: CM Consult Post Acute Care Choice: Home Health          DME Arranged: Otho Blitz rolling DME Agency: [facility] Date DME Agency Contacted: 04/12/24 Time DME Agency Contacted: [phone] Representative spoke with at DME Agency: [facility] HH Arranged: PT, OT HH Agency: [facility] Date [facility] Agency Contacted: 04/11/24 Time HH Agency Contacted: [phone] Representative spoke with at [facility] Agency: [place]   Social Drivers of Health (SDOH) Interventions SDOH Screenings   Food Insecurity: Patient Unable To Answer (04/08/2024)  Housing: Patient Unable To Answer (04/08/2024)  Transportation Needs: Patient Unable To Answer (04/08/2024)  Utilities: Patient Unable To Answer (04/08/2024)  Social Connections: Unknown (04/03/2022)    Received from [facility], [facility]  Tobacco Use: Low Risk  (04/07/2024)     Readmission Risk Interventions     No data to display

## 2024-04-12 NOTE — Progress Notes (Signed)
 Patient is alert and oriented x 4. Discharge instruction given. All questions are answered. No any questions or concerns at this time. Peripheral I/v removed.

## 2024-04-12 NOTE — Discharge Summary (Signed)
 Samantha Burgess ZOX:[id number] DOB: [birth year] DOA: 04/07/2024  PCP: Ladonna Pickup, MD  Admit date: 04/07/2024 Discharge date: 04/12/2024  Time spent: 35 minutes  Recommendations for Outpatient Follow-up:  Pcp f/u 5/27 as scheduled Orthopedics f/u (Dr. Annabell Key) 2 weeks, needs to call and schedule Attention to glucose control at PCP f/u Neurology f/u 1 month, needs to get schedule (referral placed to [facility] neurology)     Discharge Diagnoses:  Principal Problem:   Seizure [facility]) Active Problems:   Essential hypertension   Type 2 diabetes mellitus with stage 2 chronic kidney disease, with long-term current use of insulin (HCC)   Obesity (BMI 30-39.9)   Patella fracture   HHS (hypothenar hammer syndrome) (HCC)   Discharge Condition: improved  Diet recommendation: carb modified  Filed Weights   04/09/24 0500 04/10/24 0500 04/11/24 0500  Weight: 80.4 kg 80.4 kg 108.2 kg    History of present illness:  From admission h and p Samantha Burgess is a 63 y.o. 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 Patient presents to  s/ witnessed tonic clonic seizure at home. Per EMS patient was post ictal on arrival. In the field patient CBG was 127. LKW 9pm.Patient transported to ED for further care. Per family patient did not have any bowel or bladder incontinence and had LOC for around 30 sec per chart. Patient currently s/p ativan given for agitation. Patient not able to give history at this time and history is taken from chart as patient is sedated.   Hospital Course:   # HHS # T2DM, uncontrolled Obtundation and seizure may have been 2/2 hhs. Much improved, at home has not been compliant with meds. A1c > 15.5. - close PCP f/u - medication compliance discussed   # Seizure? Encephalopathy resolved. MRI motion degraded but  negative, CTA negative. HHS most likely culprit. PRES possible. CT venogram negative for dvst.  - keppra per neuro, advises continuing at discharge pending outpatient neurology f/u (referral order placed)   # Fever # Meningitis? # SIRS Initial concern for meningitis. Respiratory panels negative. Cxr clear. Traumatic tap, meningitis pcr negative. Meningitis now seems unlikely, after discussion w. ID will continue abx amoxicillin  1 g tid to complete a 14 wk course   # Patellar fracture Samantha Burgess and injured left knee a couple of weeks ago. CT here shows patellar fracture - ortho (Dr. Annabell Key) advises knee immobilizer (placed), WBAT, ortho f/u 2 weeks   # Hypertensive urgency/emergency MRI nothing acute. BP has normalized - compliance with home ramipril, hctz   # Chronic pain - continue home oxy   # Debility PT advises home health with rolling walker, ordered  Procedures: Lumbar puncture   Consultations: IR, ID, neurology, pccm  Discharge Exam: Vitals:   04/12/24 0534 04/12/24 0812  BP: (!) 141/86 133/76  Pulse: 79 77  Resp: 18 16  Temp: 97.7 F (36.5 C) 98 F (36.7 C)  SpO2: 100% 96%    General exam: NAD Respiratory system: Clear to auscultation. Respiratory effort normal. Cardiovascular system: S1 & S2 heard, tachycardic Gastrointestinal system: Abdomen is obese, soft and nontender.   Central nervous system: moving all 4, aao x4 Extremities: mild effusion left knee which is warm and tender Skin: No rashes, lesions or ulcers Psychiatry: calm     Discharge Instructions   Discharge Instructions     Ambulatory referral to Neurology  Complete by: As directed    Diet - low sodium heart healthy   Complete by: As directed    Increase activity slowly   Complete by: As directed       Allergies as of 04/12/2024   No Known Allergies      Medication List     STOP taking these medications    insulin aspart protamine- aspart (70-30) 100 UNIT/ML injection Commonly  known as: NOVOLOG MIX 70/30       TAKE these medications    albuterol  108 (90 Base) MCG/ACT inhaler Commonly known as: VENTOLIN  HFA Inhale 2 puffs into the lungs every 4 (four) hours as needed for shortness of breath.   amitriptyline 10 MG tablet Commonly known as: ELAVIL Take 40-50 mg by mouth at bedtime.   amoxicillin  500 MG tablet Commonly known as: AMOXIL  Take 2 tablets (1,000 mg total) by mouth 3 (three) times daily for 10 days.   benzonatate  100 MG capsule Commonly known as: Tessalon  Perles Take 1-2 tabs TID prn cough   fluticasone  50 MCG/ACT nasal spray Commonly known as: FLONASE  Place 1 spray into both nostrils daily.   HumaLOG 100 UNIT/ML injection Generic drug: insulin lispro Inject 0.1 mLs (10 Units total) into the skin 2 (two) times daily. What changed: how much to take   hydrochlorothiazide 25 MG tablet Commonly known as: HYDRODIURIL Take 25 mg by mouth daily.   insulin detemir 100 UNIT/ML injection Commonly known as: LEVEMIR Inject 30 Units into the skin at bedtime.   levETIRAcetam 500 MG tablet Commonly known as: KEPPRA Take 1 tablet (500 mg total) by mouth 2 (two) times daily.   oxyCODONE-acetaminophen  10-325 MG tablet Commonly known as: PERCOCET Take 1 tablet by mouth 4 (four) times daily as needed for pain.   ramipril 10 MG capsule Commonly known as: ALTACE Take 10 mg by mouth daily.   simvastatin 40 MG tablet Commonly known as: ZOCOR Take 40 mg by mouth daily.   sitaGLIPtin-metformin 50-1000 MG tablet Commonly known as: JANUMET Take 1 tablet by mouth 2 (two) times daily with a meal.               Durable Medical Equipment  (From admission, onward)           Start     Ordered   04/12/24 1059  For home use only DME Walker rolling  Once       Question Answer Comment  Walker: With 5 Inch Wheels   Patient needs a walker to treat with the following condition Patella fracture      04/12/24 1058           No Known  Allergies  Follow-up Information     [facility] Follow up.   Specialty: [facility] Contact information: [address] [place] [address] [place] [address] [phone]         Ladonna Pickup, MD Follow up.   Specialty: Internal Medicine Why: hospital follow up Contact information: [address] [place] [address] [phone]         Marlynn Singer, MD Follow up.   Specialty: Orthopedic Surgery Why: in 2 weeks, call to schedule Contact information: [address] [place] [address] [phone]         Bufford Carne, MD. Call.   Specialty: Neurology Contact information: [phone] [facility] [address] [facility] [place] [address] [phone]  The results of significant diagnostics from this hospitalization (including imaging, microbiology, ancillary and laboratory) are listed below for reference.    Significant Diagnostic Studies: CT VENOGRAM HEAD Result Date: 04/11/2024 CLINICAL DATA:  Dural venous sinus thrombosis suspected EXAM: CT VENOGRAM HEAD TECHNIQUE: Venographic phase images of the brain were obtained following the administration of intravenous contrast. Multiplanar reformats and maximum intensity projections were generated. RADIATION DOSE REDUCTION: This exam was performed according to the departmental dose-optimization program which includes automated exposure control, adjustment of the mA and/or kV according to patient size and/or use of iterative reconstruction technique. CONTRAST:  80mL OMNIPAQUE IOHEXOL 300 MG/ML  SOLN COMPARISON:  Brain MRI from 3 days ago FINDINGS: Dural venous sinuses are diffusely patent without stricture. Unremarkable central and cortical venous enhancement. Proximal basilar fenestration. No significant arterial finding. Noncontrast images show no infarct, hemorrhage, hydrocephalus, mass, or collection. IMPRESSION: Negative CT venogram Electronically Signed    By: Ronnette Coke M.D.   On: 04/11/2024 10:42   CT KNEE LEFT W CONTRAST Result Date: 04/10/2024 CLINICAL DATA:  Knee pain and swelling.  Fever. EXAM: CT OF THE LEFT KNEE WITH CONTRAST TECHNIQUE: Multidetector CT imaging was performed following the standard protocol during bolus administration of intravenous contrast. RADIATION DOSE REDUCTION: This exam was performed according to the departmental dose-optimization program which includes automated exposure control, adjustment of the mA and/or kV according to patient size and/or use of iterative reconstruction technique. CONTRAST:  100mL OMNIPAQUE IOHEXOL 300 MG/ML  SOLN COMPARISON:  None Available. FINDINGS: Bones/Joint/Cartilage Acute transverse fracture of the patella at the junction of the proximal pole and mid pole with mild comminution. Calcification along the anterior periosteum of the patella in the vicinity of the fibrocartilaginous continuation of the quadriceps tendon. Cortical surface irregularity in the femoral trochlear groove especially centrally and along adjacent portions of the lateral facet, and inferiorly along the medial facet. Some of this may represent confluent degenerative osteochondral lesions Tricompartmental spurring compatible with ostial arthritis. Severe medial compartmental articular space narrowing compatible with severe chondral thinning, with associated subcortical sclerosis as on image 30 series 6. Small knee effusion not disproportionate to the size fracture. No obvious abnormal thickening of the synovium to indicate septic joint. No gas in the joint. Ligaments Suboptimally assessed by CT. Muscles and Tendons Mildly thickened patellar tendon. Soft tissues Prepatellar subcutaneous edema. IMPRESSION: 1. Acute transverse fracture of the patella at the junction of the proximal pole and mid pole with mild comminution. 2. Small knee effusion not disproportionate to the fracture. No obvious abnormal thickening of the synovium to  indicate septic joint. No gas in the joint. 3. Tricompartmental osteoarthritis, with severe medial compartmental articular space narrowing and subcortical sclerosis. 4. Cortical surface irregularity in the femoral trochlear groove especially centrally and along adjacent portions of the lateral facet, and inferiorly along the medial facet. Some of this may represent confluent degenerative osteochondral lesions. This is likely primarily degenerative. 5. Mildly thickened patellar tendon. 6. Prepatellar subcutaneous edema. Electronically Signed   By: Freida Jes M.D.   On: 04/10/2024 16:25   ECHOCARDIOGRAM COMPLETE Result Date: 04/09/2024    ECHOCARDIOGRAM REPORT   Patient Name:   Samantha Burgess Date of Exam: 04/08/2024 Medical Rec #:  [id number]        Height:       63.0 in Accession #:    [id number]       Weight:       165.6 lb Date of Birth:  [birth year]  BSA:          1.784 m Patient Age:    63 years         BP:           162/99 mmHg Patient Gender: F                HR:           116 bpm. Exam Location:  [facility] Procedure: 2D Echo, Cardiac Doppler and Color Doppler (Both Spectral and Color            Flow Doppler were utilized during procedure). Indications:     G45.9 TIA  History:         Patient has no prior history of Echocardiogram examinations.                  Risk Factors:Hypertension, Diabetes, Dyslipidemia and Sleep                  Apnea.  Sonographer:     Brigid Canada RDCS Referring Phys:  [id number] Wiliam Harder A THOMAS Diagnosing Phys: Antonette Batters MD IMPRESSIONS  1. Left ventricular ejection fraction, by estimation, is 55 to 60%. The left ventricle has normal function. The left ventricle has no regional wall motion abnormalities. Left ventricular diastolic parameters are consistent with Grade I diastolic dysfunction (impaired relaxation).  2. Right ventricular systolic function is normal. The right ventricular size is normal.  3. The mitral valve is normal in structure. Trivial  mitral valve regurgitation.  4. The aortic valve is normal in structure. Aortic valve regurgitation is not visualized. FINDINGS  Left Ventricle: Left ventricular ejection fraction, by estimation, is 55 to 60%. The left ventricle has normal function. The left ventricle has no regional wall motion abnormalities. Strain was performed and the global longitudinal strain is indeterminate. Global longitudinal strain performed but not reported based on interpreter judgement due to suboptimal tracking. The left ventricular internal cavity size was normal in size. There is no left ventricular hypertrophy. Left ventricular diastolic parameters are consistent with Grade I diastolic dysfunction (impaired relaxation). Right Ventricle: The right ventricular size is normal. No increase in right ventricular wall thickness. Right ventricular systolic function is normal. Left Atrium: Left atrial size was normal in size. Right Atrium: Right atrial size was normal in size. Pericardium: There is no evidence of pericardial effusion. Mitral Valve: The mitral valve is normal in structure. Trivial mitral valve regurgitation. Tricuspid Valve: The tricuspid valve is normal in structure. Tricuspid valve regurgitation is trivial. Aortic Valve: The aortic valve is normal in structure. Aortic valve regurgitation is not visualized. Pulmonic Valve: The pulmonic valve was normal in structure. Pulmonic valve regurgitation is not visualized. Aorta: The ascending aorta was not well visualized. IAS/Shunts: No atrial level shunt detected by color flow Doppler. Additional Comments: 3D was performed not requiring image post processing on an independent workstation and was indeterminate.  LEFT VENTRICLE PLAX 2D LVIDd:         4.50 cm   Diastology LVIDs:         3.20 cm   LV e' medial:    6.96 cm/s LV PW:         1.00 cm   LV E/e' medial:  13.3 LV IVS:        1.30 cm   LV e' lateral:   9.78 cm/s LVOT diam:     2.20 cm   LV E/e' lateral: 9.5 LV SV:  71  LV SV Index:   40 LVOT Area:     3.80 cm  RIGHT VENTRICLE             IVC RV Basal diam:  3.70 cm     IVC diam: 1.20 cm RV S prime:     17.25 cm/s TAPSE (M-mode): 2.6 cm LEFT ATRIUM             Index        RIGHT ATRIUM           Index LA diam:        4.70 cm 2.63 cm/m   RA Area:     10.50 cm LA Vol (A2C):   50.4 ml 28.24 ml/m  RA Volume:   23.40 ml  13.11 ml/m LA Vol (A4C):   33.1 ml 18.55 ml/m LA Biplane Vol: 43.7 ml 24.49 ml/m  AORTIC VALVE LVOT Vmax:   138.33 cm/s LVOT Vmean:  93.667 cm/s LVOT VTI:    0.188 m  AORTA Ao Root diam: 3.30 cm MITRAL VALVE MV Area (PHT): 5.66 cm     SHUNTS MV Decel Time: 134 msec     Systemic VTI:  0.19 m MV E velocity: 92.55 cm/s   Systemic Diam: 2.20 cm MV A velocity: 141.50 cm/s MV E/A ratio:  0.65 Dwayne D Callwood MD Electronically signed by Antonette Batters MD Signature Date/Time: 04/09/2024/1:21:27 PM    Final    DG FL GUIDED LUMBAR PUNCTURE Result Date: 04/09/2024 CLINICAL DATA:  63 year old female with new onset seizures, altered mental status. IR was requested for diagnostic lumbar puncture. EXAM: LUMBAR PUNCTURE UNDER FLUOROSCOPY PROCEDURE: An appropriate skin entry site was determined fluoroscopically. Operator donned sterile gloves and mask. Skin site was marked, then prepped with Betadine, draped in usual sterile fashion, and infiltrated locally with 1% lidocaine . A 20 gauge spinal needle advanced into the thecal sac at L4-L5 from a left interlaminar approach. Clear colorless CSF spontaneously returned, however, patient remained agitated, precluding safe collection of fluid. Procedure was aborted, and the needle was removed. The patient tolerated the procedure without difficulty. FLUOROSCOPY: Radiation Exposure Index (as provided by the fluoroscopic device): 72.50 mGy Kerma IMPRESSION: Technically successful lumbar puncture under fluoroscopy. Fluid collection was interrupted by patient agitation, prompting abortion of procedure. This exam was performed by  Lambert Pillion, PA-C, and was supervised and interpreted by Dr. Myrlene Asper, MD. Electronically Signed   By: Myrlene Asper D.O.   On: 04/09/2024 13:03   EEG adult Result Date: 04/08/2024 Samantha Griffin, MD     04/09/2024 11:12 AM Routine EEG Report Samantha Burgess is a 63 y.o. female with a history of seizure who is undergoing an EEG to evaluate for seizures. Report: This EEG was acquired with electrodes placed according to the International 10-20 electrode system (including Fp1, Fp2, F3, F4, C3, C4, P3, P4, O1, O2, T3, T4, T5, T6, A1, A2, Fz, Cz, Pz). The following electrodes were missing or displaced: none. The occipital dominant rhythm was 6-7 Hz. This activity is reactive to stimulation. Drowsiness was manifested by background fragmentation; deeper stages of sleep were identified by K complexes and sleep spindles. There was no focal slowing. There were no interictal epileptiform discharges. There were no electrographic seizures identified. There was no abnormal response to photic stimulation or hyperventilation. Impression and clinical correlation: This EEG was obtained while awake and asleep and is abnormal due to mild-to-moderate diffuse slowing indicative of global cerebral dysfunction. Epileptiform abnormalities were not seen during this  recording. Greg Leaks, MD [facility] [phone] If 7pm- 7am, please page neurology on call as listed in AMION.   MR BRAIN WO CONTRAST Result Date: 04/08/2024 CLINICAL DATA:  Transient ischemic attack EXAM: MRI HEAD WITHOUT CONTRAST TECHNIQUE: Multiplanar, multiecho pulse sequences of the brain and surrounding structures were obtained without intravenous contrast. COMPARISON:  Head CT 04/08/2024 FINDINGS: Markedly motion degraded examination. Only 5 sequences were performed. There is no acute infarct. No midline shift or other mass effect. Otherwise, the examination is limited. IMPRESSION: Markedly motion degraded examination. No acute infarct.  Electronically Signed   By: Juanetta Nordmann M.D.   On: 04/08/2024 13:17   DG Chest 2 View Result Date: 04/08/2024 CLINICAL DATA:  Seizure disorder. EXAM: CHEST - 2 VIEW COMPARISON:  January 28, 2024. FINDINGS: Stable cardiomediastinal silhouette. Both lungs are clear. The visualized skeletal structures are unremarkable. IMPRESSION: No active cardiopulmonary disease. Electronically Signed   By: Rosalene Colon M.D.   On: 04/08/2024 12:45   CT Angio Head Neck W WO CM (CODE STROKE) Result Date: 04/08/2024 CLINICAL DATA:  Initial evaluation for acute neuro deficit, stroke. EXAM: CT ANGIOGRAPHY HEAD AND NECK CT PERFUSION BRAIN TECHNIQUE: Multidetector CT imaging of the head and neck was performed using the standard protocol during bolus administration of intravenous contrast. Multiplanar CT image reconstructions and MIPs were obtained to evaluate the vascular anatomy. Carotid stenosis measurements (when applicable) are obtained utilizing NASCET criteria, using the distal internal carotid diameter as the denominator. Multiphase CT imaging of the brain was performed following IV bolus contrast injection. Subsequent parametric perfusion maps were calculated using RAPID software. RADIATION DOSE REDUCTION: This exam was performed according to the departmental dose-optimization program which includes automated exposure control, adjustment of the mA and/or kV according to patient size and/or use of iterative reconstruction technique. CONTRAST:  100mL OMNIPAQUE IOHEXOL 350 MG/ML SOLN COMPARISON:  Prior CT from 04/07/2024. FINDINGS: CTA NECK FINDINGS Aortic arch: Standard branching. Imaged portion shows no evidence of aneurysm or dissection. No significant stenosis of the major arch vessel origins. Right carotid system: No evidence of dissection, stenosis (50% or greater) or occlusion. Left carotid system: No evidence of dissection, stenosis (50% or greater) or occlusion. Vertebral arteries: Codominant. No evidence of  dissection, stenosis (50% or greater) or occlusion. Skeleton: No worrisome osseous lesions. Other neck: No other acute finding. Upper chest: No other acute finding. Review of the MIP images confirms the above findings CTA HEAD FINDINGS Anterior circulation: Both internal carotid arteries widely patent to the termini without stenosis. A1 segments widely patent. Normal anterior communicating artery complex. Both anterior cerebral arteries widely patent to their distal aspects without stenosis. No M1 stenosis or occlusion. Normal MCA bifurcations. Distal MCA branches well perfused and symmetric. Posterior circulation: Both V4 segments patent without stenosis. Both PICA patent at their origins. 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 Basilar patent without stenosis. Superior cerebellar and posterior cerebral arteries patent bilaterally. Venous sinuses: Patent allowing for timing the contrast bolus. Anatomic variants: None significant.  No aneurysm. Review of the MIP images confirms the above findings CT Brain Perfusion Findings: CBF (<30%) Volume: 0mL Perfusion (Tmax>6.0s) volume: 4mL Mismatch Volume: 4mL Infarction Location:Negative CT perfusion with no evidence for acute core infarct. 4 mL area of delayed perfusion overlies the skull base, consistent with artifact. No other perfusion abnormality. IMPRESSION: 1. Negative CTA of the head and neck. No large vessel occlusion or other emergent finding. No hemodynamically significant or correctable stenosis. 2. Negative CT perfusion with  no evidence for acute ischemia or other perfusion abnormality. Electronically Signed   By: Virgia Griffins M.D.   On: 04/08/2024 01:20   CT CEREBRAL PERFUSION W CONTRAST Result Date: 04/08/2024 CLINICAL DATA:  Initial evaluation for acute neuro deficit, stroke. EXAM: CT ANGIOGRAPHY HEAD AND NECK CT PERFUSION BRAIN TECHNIQUE: Multidetector CT imaging of the head and neck was performed using the  standard protocol during bolus administration of intravenous contrast. Multiplanar CT image reconstructions and MIPs were obtained to evaluate the vascular anatomy. Carotid stenosis measurements (when applicable) are obtained utilizing NASCET criteria, using the distal internal carotid diameter as the denominator. Multiphase CT imaging of the brain was performed following IV bolus contrast injection. Subsequent parametric perfusion maps were calculated using RAPID software. RADIATION DOSE REDUCTION: This exam was performed according to the departmental dose-optimization program which includes automated exposure control, adjustment of the mA and/or kV according to patient size and/or use of iterative reconstruction technique. CONTRAST:  OMNIPAQUE  IOHEXOL  350 MG/ML SOLN COMPARISON:  Prior CT from 04/07/2024. FINDINGS: CTA NECK FINDINGS Aortic arch: Standard branching. Imaged portion shows no evidence of aneurysm or dissection. No significant stenosis of the major arch vessel origins. Right carotid system: No evidence of dissection, stenosis (50% or greater) or occlusion. Left carotid system: No evidence of dissection, stenosis (50% or greater) or occlusion. Vertebral arteries: Codominant. No evidence of dissection, stenosis (50% or greater) or occlusion. Skeleton: No worrisome osseous lesions. Other neck: No other acute finding. Upper chest: No other acute finding. Review of the MIP images confirms the above findings CTA HEAD FINDINGS Anterior circulation: Both internal carotid arteries widely patent to the termini without stenosis. A1 segments widely patent. Normal anterior communicating artery complex. Both anterior cerebral arteries widely patent to their distal aspects without stenosis. No M1 stenosis or occlusion. Normal MCA bifurcations. Distal MCA branches well perfused and symmetric. Posterior circulation: Both V4 segments patent without stenosis. Both PICA patent at their origins. 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 Basilar patent without stenosis. Superior cerebellar and posterior cerebral arteries patent bilaterally. Venous sinuses: Patent allowing for timing the contrast bolus. Anatomic variants: None significant.  No aneurysm. Review of the MIP images confirms the above findings CT Brain Perfusion Findings: CBF (<30%) Volume: 0mL Perfusion (Tmax>6.0s) volume: 4mL Mismatch Volume: 4mL Infarction Location:Negative CT perfusion with no evidence for acute core infarct. 4 mL area of delayed perfusion overlies the skull base, consistent with artifact. No other perfusion abnormality. IMPRESSION: 1. Negative CTA of the head and neck. No large vessel occlusion or other emergent finding. No hemodynamically significant or correctable stenosis. 2. Negative CT perfusion with no evidence for acute ischemia or other perfusion abnormality. Electronically Signed   By: Virgia Griffins M.D.   On: 04/08/2024 01:20   CT Head Wo Contrast Result Date: 04/07/2024 CLINICAL DATA:  New onset seizure. EXAM: CT HEAD WITHOUT CONTRAST TECHNIQUE: Contiguous axial images were obtained from the base of the skull through the vertex without intravenous contrast. RADIATION DOSE REDUCTION: This exam was performed according to the departmental dose-optimization program which includes automated exposure control, adjustment of the mA and/or kV according to patient size and/or use of iterative reconstruction technique. COMPARISON:  None Available. FINDINGS: Brain: There is mild cerebral atrophy with widening of the extra-axial spaces and ventricular dilatation. There are areas of decreased attenuation within the white matter tracts of the supratentorial brain, consistent with microvascular disease changes. Vascular: No hyperdense vessel or unexpected calcification. Skull: Normal. Negative for fracture or  focal lesion. Sinuses/Orbits: The right lens is not clearly identified. Other: None. IMPRESSION: 1.  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 2. No acute intracranial abnormality. 3. The right lens is not clearly identified. Correlation with the patient's surgical history is recommended. Electronically Signed   By: Virgle Grime M.D.   On: 04/07/2024 22:31    Microbiology: Recent Results (from the past 240 hours)  MRSA Next Gen by PCR, Nasal     Status: None   Collection Time: 04/08/24  3:10 AM   Specimen: Nasal Mucosa; Nasal Swab  Result Value Ref Range Status   MRSA by PCR Next Gen NOT DETECTED NOT DETECTED Final    Comment: (NOTE) The GeneXpert MRSA Assay (FDA approved for NASAL specimens only), is one component of a comprehensive MRSA colonization surveillance program. It is not intended to diagnose MRSA infection nor to guide or monitor treatment for MRSA infections. Test performance is not FDA approved in patients less than 77 years old. Performed at [facility], [address] [place], [place] [address]   Respiratory (~20 pathogens) panel by PCR     Status: None   Collection Time: 04/08/24  3:29 PM   Specimen: Nasopharyngeal Swab; Respiratory  Result Value Ref Range Status   Adenovirus NOT DETECTED NOT DETECTED Final   Coronavirus 229E NOT DETECTED NOT DETECTED Final    Comment: (NOTE) The Coronavirus on the Respiratory Panel, DOES NOT test for the novel  Coronavirus (2019 nCoV)    Coronavirus HKU1 NOT DETECTED NOT DETECTED Final   Coronavirus NL63 NOT DETECTED NOT DETECTED Final   Coronavirus OC43 NOT DETECTED NOT DETECTED Final   Metapneumovirus NOT DETECTED NOT DETECTED Final   Rhinovirus / Enterovirus NOT DETECTED NOT DETECTED Final   Influenza A NOT DETECTED NOT DETECTED Final   Influenza B NOT DETECTED NOT DETECTED Final   Parainfluenza Virus 1 NOT DETECTED NOT DETECTED Final   Parainfluenza Virus 2 NOT DETECTED NOT DETECTED Final   Parainfluenza Virus 3 NOT DETECTED NOT DETECTED Final   Parainfluenza  Virus 4 NOT DETECTED NOT DETECTED Final   Respiratory Syncytial Virus NOT DETECTED NOT DETECTED Final   Bordetella pertussis NOT DETECTED NOT DETECTED Final   Bordetella Parapertussis NOT DETECTED NOT DETECTED Final   Chlamydophila pneumoniae NOT DETECTED NOT DETECTED Final   Mycoplasma pneumoniae NOT DETECTED NOT DETECTED Final    Comment: Performed at [facility], [address] [place], [place] [address]  Culture, blood (Routine X 2) w Reflex to ID Panel     Status: None (Preliminary result)   Collection Time: 04/08/24  4:38 PM   Specimen: BLOOD  Result Value Ref Range Status   Specimen Description BLOOD BLOOD LEFT HAND  Final   Special Requests   Final    BOTTLES DRAWN AEROBIC AND ANAEROBIC Blood Culture results may not be optimal due to an inadequate volume of blood received in culture bottles   Culture   Final    NO GROWTH 4 DAYS Performed at [facility], [address] [place], [place] [address]    Report Status PENDING  Incomplete  Culture, blood (Routine X 2) w Reflex to ID Panel     Status: None (Preliminary result)   Collection Time: 04/08/24  4:38 PM   Specimen: BLOOD  Result Value Ref Range Status   Specimen Description BLOOD BLOOD RIGHT WRIST  Final   Special Requests   Final    BOTTLES DRAWN AEROBIC AND ANAEROBIC  Blood Culture adequate volume   Culture   Final    NO GROWTH 4 DAYS Performed at [facility], [address] [place], [place] [address]    Report Status PENDING  Incomplete  CSF culture w Gram Stain     Status: None (Preliminary result)   Collection Time: 04/09/24  4:08 PM   Specimen: CSF; Cerebrospinal Fluid  Result Value Ref Range Status   Specimen Description   Final    CSF Performed at [facility], [address], [place], [place] [address]    Special Requests   Final    CSF Performed at [facility], [address] [place], [place] [address]    Gram Stain   Final    WBC SEEN RED BLOOD CELLS  PRESENT NO ORGANISMS SEEN Performed at [facility], [address] [place], [place] [address]    Culture   Final    NO GROWTH 3 DAYS Performed at [facility], [address], [place], [place] [address]    Report Status PENDING  Incomplete     Labs: Basic Metabolic Panel: Recent Labs  Lab 04/08/24 0828 04/09/24 0315 04/10/24 0507 04/11/24 0457 04/12/24 0601  NA 133* 133* 138 133* 136  K 3.7 3.9 3.4* 3.7 4.0  CL 98 99 107 104 102  CO2 22 22 24  18* 23  GLUCOSE 269* 294* 94 216* 166*  BUN 12 9 24* 21 13  CREATININE 0.74 0.76 0.92 0.74 0.73  CALCIUM 9.2 9.0 8.3* 8.2* 8.8*  MG 1.7 2.0 2.0 2.0  --   PHOS 2.1* 2.8 4.2 3.7  --    Liver Function Tests: Recent Labs  Lab 04/08/24 0828  AST 22  ALT 16  ALKPHOS 128*  BILITOT 0.5  PROT 7.3  ALBUMIN 3.9   No results for input(s): "LIPASE", "AMYLASE" in the last 168 hours. No results for input(s): "AMMONIA" in the last 168 hours. CBC: Recent Labs  Lab 04/07/24 2208 04/08/24 0836 04/09/24 0315 04/10/24 0507 04/11/24 0457  WBC 6.8 11.5* 11.5* 10.8* 11.0*  NEUTROABS 3.7  --   --   --   --   HGB 13.1 13.2 14.8 11.8* 12.5  HCT 39.9 40.1 43.8 35.1* 37.1  MCV 85.4 85.0 84.2 85.6 85.3  PLT 392 402* 401* 342 349   Cardiac Enzymes: Recent Labs  Lab 04/10/24 0507  CKTOTAL 46   BNP: BNP (last 3 results) No results for input(s): "BNP" in the last 8760 hours.  ProBNP (last 3 results) No results for input(s): "PROBNP" in the last 8760 hours.  CBG: Recent Labs  Lab 04/11/24 1204 04/11/24 1553 04/11/24 2004 04/12/24 0535 04/12/24 0815  GLUCAP 195* 210* 226* 160* 150*       Signed:  Raymonde Calico MD.  [facility] 04/12/2024, 11:18 AM

## 2024-04-12 NOTE — Plan of Care (Signed)

## 2024-04-13 LAB — CULTURE, BLOOD (ROUTINE X 2)
Culture: NO GROWTH
Culture: NO GROWTH
Special Requests: ADEQUATE

## 2024-04-13 LAB — CSF CULTURE W GRAM STAIN: Culture: NO GROWTH

## 2024-07-30 ENCOUNTER — Other Ambulatory Visit: Payer: Self-pay | Admitting: Obstetrics and Gynecology
